# Patient Record
Sex: Male | Born: 1985 | Race: White | Hispanic: No | Marital: Single | Smoking: Former smoker
Health system: Southern US, Community
[De-identification: ages and names within clinical notes are randomized; demographics above are authoritative.]

## PROBLEM LIST (undated history)

## (undated) DIAGNOSIS — Z8679 Personal history of other diseases of the circulatory system: Secondary | ICD-10-CM

## (undated) DIAGNOSIS — I10 Essential (primary) hypertension: Secondary | ICD-10-CM

---

## 2004-06-18 ENCOUNTER — Emergency Department: Payer: Self-pay | Admitting: Emergency Medicine

## 2016-06-05 ENCOUNTER — Emergency Department
Admission: EM | Admit: 2016-06-05 | Discharge: 2016-06-05 | Disposition: A | Discharge status: Home / Self Care | Payer: Self-pay | Attending: Emergency Medicine | Admitting: Emergency Medicine

## 2016-06-05 ENCOUNTER — Encounter: Payer: Self-pay | Admitting: Emergency Medicine

## 2016-06-05 DIAGNOSIS — L0591 Pilonidal cyst without abscess: Secondary | ICD-10-CM | POA: Insufficient documentation

## 2016-06-05 MED ORDER — CEPHALEXIN 500 MG PO CAPS: 500.0000 mg | ORAL_CAPSULE | Freq: Four times a day (QID) | ORAL | 0 refills | Status: DC

## 2016-06-05 MED ORDER — CLINDAMYCIN HCL 300 MG PO CAPS: 300.0000 mg | ORAL_CAPSULE | Freq: Three times a day (TID) | ORAL | 0 refills | Status: DC

## 2016-06-05 MED ORDER — HYDROCODONE-ACETAMINOPHEN 5-325 MG PO TABS: 1.0000 | ORAL_TABLET | Freq: Four times a day (QID) | ORAL | 0 refills | Status: DC | PRN

## 2016-06-05 NOTE — ED Notes (Signed)
Patient here for sciatica pain.

## 2016-06-05 NOTE — ED Triage Notes (Signed)
Patient ambulatory to triage with steady gait, without difficulty or distress noted; pt reports pain to backside of left buttock/thigh upon awakening this am with no known cause

## 2016-06-05 NOTE — Discharge Instructions (Signed)
You have been seen today in the Emergency Department (ED) for pain along the edge of your buttock which I suspect is from a superficial skin infection. Please take your antibiotics as prescribed for their ENTIRE prescribed duration.  Please follow up with your doctor or in the ED in 24-48 hours for recheck of your infection if you are not improving.  Call your doctor sooner or return to the ED if you develop worsening signs of infection such as: increased redness, increased pain, pus, fever, or other symptoms that concern you.

## 2016-06-05 NOTE — ED Notes (Signed)
Pt. Denies injury. Shooting pain through the Left buttock/side/thigh for several days, worse with activity. Pt. States as long as still or moderate counter-pressure, pain eases slightly.

## 2016-06-05 NOTE — ED Provider Notes (Signed)
[facility] Emergency Department Provider Note   ____________________________________________   First MD Initiated Contact with Patient 06/05/16 [phone]     (approximate)  I have reviewed the triage vital signs and the nursing notes.   HISTORY  Chief Complaint Leg Pain    HPI Karl Nicholson is a 30 y.o. male having pain along the edge of his left buttock, along the cleft for about 2-3 days. No redness fever or chills. No pain in or around his rectum.  Patient reports he is healthy. This has never happened to him before. No numbness tingling or weakness. No poor circulation painful leg or foot. Reports the pain is clear on his left buttock, right in the crease between the cheeks.   History reviewed. No pertinent past medical history. Denies previous history There are no active problems to display for this patient.   History reviewed. No pertinent surgical history.  Prior to Admission medications   Medication Sig Start Date End Date Taking? Authorizing Provider  cephALEXin (KEFLEX) 500 MG capsule Take 1 capsule (500 mg total) by mouth 4 (four) times daily. 06/05/16   Sharyn CreamerMark Quale, MD  HYDROcodone-acetaminophen (NORCO/VICODIN) 5-325 MG tablet Take 1 tablet by mouth every 6 (six) hours as needed for moderate pain. 06/05/16   Sharyn CreamerMark Quale, MD    Allergies Penicillins  History reviewed. No pertinent family history.  Social History Social History  Substance Use Topics  . Smoking status: Never Smoker  . Smokeless tobacco: Never Used  . Alcohol use No    Review of Systems Constitutional: No fever/chills Eyes: No visual changes. ENT: No sore throat. Cardiovascular: Denies chest pain. Respiratory: Denies shortness of breath. Gastrointestinal: No abdominal pain.  No nausea, no vomiting.  No diarrhea.  No constipation. Genitourinary: Negative for dysuria. Musculoskeletal: Negative for back pain. No pain in the legs. No pain in his testicles or  groin. Skin: Negative for rash. Neurological: Negative for headaches, focal weakness or numbness.  10-point ROS otherwise negative.  ____________________________________________   PHYSICAL EXAM:  VITAL SIGNS: ED Triage Vitals  Enc Vitals Group     BP --      Pulse Rate 06/05/16 0640 86     Resp 06/05/16 0640 18     Temp 06/05/16 0640 97.4 F (36.3 C)     Temp Source 06/05/16 0640 Oral     SpO2 06/05/16 0640 96 %     Weight 06/05/16 0640 (!) 340 lb (154.2 kg)     Height 06/05/16 0640 6' (1.829 m)     Head Circumference --      Peak Flow --      Pain Score 06/05/16 0641 8     Pain Loc --      Pain Edu? --      Excl. in GC? --     Constitutional: Alert and oriented. Well appearing and in no acute distress. Eyes: Conjunctivae are normal. PERRL. EOMI. Head: Atraumatic. Nose: No congestion/rhinnorhea. Mouth/Throat: Mucous membranes are moist.  Oropharynx non-erythematous. Neck: No stridor.   Cardiovascular: Normal rate, regular rhythm. Grossly normal heart sounds.  Good peripheral circulation. Respiratory: Normal respiratory effort.  No retractions. Lungs CTAB. Gastrointestinal: Soft and nontender. No distention. No abdominal bruits. No CVA tenderness. Musculoskeletal: Lower Extremities  Normal scrotum and testicles bilateral. No groin pain or mass.  The patient's gluteal cleft was examined closely, he has focal tenderness along the middle portion of the left gluteal cleft, but no tenderness perirectally or within the rectum on digital  examination. No herniations or evidence of erythema or infection along the perineum.  The patient has point tenderness at the base of a small group of hair follicles on the left gluteal cleft, with without evidence of erythema or large fluid collection.  No edema. Normal DP/PT pulses bilateral with good cap refill.  Normal neuro-motor function lower extremities bilateral.  RIGHT Right lower extremity demonstrates normal strength, good use  of all muscles. No edema bruising or contusions of the right hip, right knee, right ankle. Full range of motion of the right lower extremity without pain. No pain on axial loading. No evidence of trauma.  LEFT Left lower extremity demonstrates normal strength, good use of all muscles. No edema bruising or contusions of the hip,  knee, ankle. Full range of motion of the left lower extremity without pain. No pain on axial loading. No evidence of trauma.  Neurologic:  Normal speech and language. No gross focal neurologic deficits are appreciated. No gait instability. Skin:  Skin is warm, dry and intact. No rash noted. Psychiatric: Mood and affect are normal. Speech and behavior are normal.  ____________________________________________   LABS (all labs ordered are listed, but only abnormal results are displayed)  Labs Reviewed - No data to display ____________________________________________  EKG   ____________________________________________  RADIOLOGY   ____________________________________________   PROCEDURES  Procedure(s) performed: None  Procedures  Critical Care performed: No  ____________________________________________   INITIAL IMPRESSION / ASSESSMENT AND PLAN / ED COURSE  Pertinent labs & imaging results that were available during my care of the patient were reviewed by me and considered in my medical decision making (see chart for details).  Patient presentation of left medial buttock pain, very focal area of tenderness along the base of a few hair follicles. No evidence of perirectal abscess. No evidence of obvious superinfection or abscess noted. I suspect the patient may have a small left gluteal cleft cyst developing, though no evidence of complication. He'll start him on antibiotic, we did discuss options but based on his financial constraints we will select from the $4 list at [facility]. He has a history of penicillin, but he reports that he can tolerate  amoxicillin and has no other allergies. He was told he may have an allergy to it when he was a child.  I will prescribe the patient a narcotic pain medicine due to their condition which I anticipate will cause at least moderate pain short term. I discussed with the patient safe use of narcotic pain medicines, and that they are not to drive, work in dangerous areas, or ever take more than prescribed (no more than 1 pill every 6 hours). We discussed that this is the type of medication that can be  overdosed on and the risks of this type of medicine. Patient is very agreeable to only use as prescribed and to never use more than prescribed.  Patient given follow-up instructions for general surgery for reevaluation. Return precautions and treatment recommendations and follow-up discussed with the patient who is agreeable with the plan.   Clinical Course     ____________________________________________   FINAL CLINICAL IMPRESSION(S) / ED DIAGNOSES  Final diagnoses:  Infected pilonidal cyst  Pilonidal cyst without abscess      NEW MEDICATIONS STARTED DURING THIS VISIT:  New Prescriptions   CEPHALEXIN (KEFLEX) 500 MG CAPSULE    Take 1 capsule (500 mg total) by mouth 4 (four) times daily.   HYDROCODONE-ACETAMINOPHEN (NORCO/VICODIN) 5-325 MG TABLET    Take 1 tablet by mouth  every 6 (six) hours as needed for moderate pain.     Note:  This document was prepared using Dragon voice recognition software and may include unintentional dictation errors.     Sharyn Creamer, MD 06/05/16 0730

## 2016-06-05 NOTE — ED Notes (Signed)
ED MD at bedside at this time.

## 2016-07-03 ENCOUNTER — Telehealth: Payer: Self-pay

## 2016-07-03 NOTE — Telephone Encounter (Signed)
Called and left a message on patient's voicemail.   Dr. Everlene FarrierPabon wants to follow up with patient in reference to his pilonidal cyst. Patient was seen at the emergency room on 06/05/2016. If patient calls, please schedule him an appointment to see Dr. Everlene FarrierPabon this week.

## 2016-09-17 ENCOUNTER — Emergency Department
Admission: EM | Admit: 2016-09-17 | Discharge: 2016-09-17 | Disposition: A | Discharge status: Home / Self Care | Payer: Self-pay | Attending: Emergency Medicine | Admitting: Emergency Medicine

## 2016-09-17 ENCOUNTER — Emergency Department: Payer: Self-pay

## 2016-09-17 ENCOUNTER — Encounter: Payer: Self-pay | Admitting: Emergency Medicine

## 2016-09-17 DIAGNOSIS — R0789 Other chest pain: Secondary | ICD-10-CM | POA: Insufficient documentation

## 2016-09-17 DIAGNOSIS — R079 Chest pain, unspecified: Secondary | ICD-10-CM

## 2016-09-17 LAB — TROPONIN I

## 2016-09-17 LAB — COMPREHENSIVE METABOLIC PANEL
ALT: 45 U/L (ref 17–63)
ANION GAP: 11 (ref 5–15)
AST: 35 U/L (ref 15–41)
Albumin: 3.9 g/dL (ref 3.5–5.0)
Alkaline Phosphatase: 67 U/L (ref 38–126)
BILIRUBIN TOTAL: 1.3 mg/dL — AB (ref 0.3–1.2)
BUN: 12 mg/dL (ref 6–20)
CHLORIDE: 103 mmol/L (ref 101–111)
CO2: 23 mmol/L (ref 22–32)
Calcium: 8.6 mg/dL — ABNORMAL LOW (ref 8.9–10.3)
Creatinine, Ser: 0.98 mg/dL (ref 0.61–1.24)
Glucose, Bld: 96 mg/dL (ref 65–99)
POTASSIUM: 3.7 mmol/L (ref 3.5–5.1)
Sodium: 137 mmol/L (ref 135–145)
TOTAL PROTEIN: 7.3 g/dL (ref 6.5–8.1)

## 2016-09-17 LAB — CBC WITH DIFFERENTIAL/PLATELET
Basophils Absolute: 0.1 10*3/uL (ref 0–0.1)
Basophils Relative: 1 %
EOS PCT: 2 %
Eosinophils Absolute: 0.2 10*3/uL (ref 0–0.7)
HCT: 43.5 % (ref 40.0–52.0)
Hemoglobin: 15.1 g/dL (ref 13.0–18.0)
LYMPHS PCT: 22 %
Lymphs Abs: 2.2 10*3/uL (ref 1.0–3.6)
MCH: 28.2 pg (ref 26.0–34.0)
MCHC: 34.7 g/dL (ref 32.0–36.0)
MCV: 81.1 fL (ref 80.0–100.0)
MONO ABS: 1.1 10*3/uL — AB (ref 0.2–1.0)
MONOS PCT: 11 %
Neutro Abs: 6.3 10*3/uL (ref 1.4–6.5)
Neutrophils Relative %: 64 %
PLATELETS: 230 10*3/uL (ref 150–440)
RBC: 5.36 MIL/uL (ref 4.40–5.90)
RDW: 13.5 % (ref 11.5–14.5)
WBC: 9.9 10*3/uL (ref 3.8–10.6)

## 2016-09-17 MED ORDER — FAMOTIDINE 20 MG PO TABS: 20.0000 mg | ORAL_TABLET | Freq: Two times a day (BID) | ORAL | 0 refills | Status: DC

## 2016-09-17 MED ORDER — ALUMINUM-MAGNESIUM-SIMETHICONE 200-200-20 MG/5ML PO SUSP: 30.0000 mL | Freq: Three times a day (TID) | ORAL | 0 refills | Status: DC

## 2016-09-17 MED ORDER — FAMOTIDINE 20 MG PO TABS
40.0000 mg | ORAL_TABLET | Freq: Once | ORAL | Status: AC
  Administered 2016-09-17: 40 mg via ORAL
  Filled 2016-09-17: qty 2

## 2016-09-17 MED ORDER — GI COCKTAIL ~~LOC~~
30.0000 mL | ORAL | Status: AC
  Administered 2016-09-17: 30 mL via ORAL
  Filled 2016-09-17: qty 30

## 2016-09-17 NOTE — ED Triage Notes (Signed)
Chest pain x 3 days. Worse after eating and taking a deep breath.  Pt states he has taken meds for reflux but not helping.  Skin w/d with good color.

## 2016-09-17 NOTE — ED Provider Notes (Signed)
[facility] Emergency Department Provider Note  ____________________________________________  Time seen: Approximately 1:55 PM  I have reviewed the triage vital signs and the nursing notes.   HISTORY  Chief Complaint Chest Pain    HPI Karl Nicholson is a 31 y.o. male who complains of 3 days of intermittent central chest tightness and sharp pain. Moderate intensity, lasting a few seconds at a time, worse with breathing. Not exertional. No shortness of breath. No cough fever chills diaphoresis vomiting or radiation. Also worse with eating. Tried various antacids without relief. Never had anything like this before. Denies medical history or surgical history.     History reviewed. No pertinent past medical history.   There are no active problems to display for this patient.    History reviewed. No pertinent surgical history.   Prior to Admission medications   Medication Sig Start Date End Date Taking? Authorizing Provider  acetaminophen (TYLENOL) 500 MG tablet Take 500 mg by mouth every 6 (six) hours as needed.   Yes Historical Provider, MD  bismuth subsalicylate (PEPTO BISMOL) 262 MG/15ML suspension Take 30 mLs by mouth every 6 (six) hours as needed.   Yes Historical Provider, MD  aluminum-magnesium hydroxide-simethicone (MAALOX) 200-200-20 MG/5ML SUSP Take 30 mLs by mouth 4 (four) times daily -  before meals and at bedtime. 09/17/16   Sharman Cheek, MD  famotidine (PEPCID) 20 MG tablet Take 1 tablet (20 mg total) by mouth 2 (two) times daily. 09/17/16   Sharman Cheek, MD     Allergies Penicillins   No family history on file.  Social History Social History  Substance Use Topics  . Smoking status: Never Smoker  . Smokeless tobacco: Never Used  . Alcohol use No    Review of Systems  Constitutional:   No fever or chills.  ENT:   No sore throat. No rhinorrhea. Cardiovascular:   Positive as above chest pain. Respiratory:   No dyspnea or  cough. Gastrointestinal:   Negative for abdominal pain, vomiting and diarrhea.  Genitourinary:   Negative for dysuria or difficulty urinating. Musculoskeletal:   Negative for focal pain or swelling Neurological:   Negative for headaches 10-point ROS otherwise negative.  ____________________________________________   PHYSICAL EXAM:  VITAL SIGNS: ED Triage Vitals  Enc Vitals Group     BP 09/17/16 1152 130/83     Pulse Rate 09/17/16 1152 88     Resp 09/17/16 1152 20     Temp 09/17/16 1152 98.3 F (36.8 C)     Temp Source 09/17/16 1152 Oral     SpO2 09/17/16 1152 94 %     Weight 09/17/16 1147 (!) 350 lb (158.8 kg)     Height 09/17/16 1147 6' (1.829 m)     Head Circumference --      Peak Flow --      Pain Score 09/17/16 1148 4     Pain Loc --      Pain Edu? --      Excl. in GC? --     Vital signs reviewed, nursing assessments reviewed.   Constitutional:   Alert and oriented. Well appearing and in no distress. Eyes:   No scleral icterus. No conjunctival pallor. PERRL. EOMI.  No nystagmus. ENT   Head:   Normocephalic and atraumatic.   Nose:   No congestion/rhinnorhea. No septal hematoma   Mouth/Throat:   MMM, no pharyngeal erythema. No peritonsillar mass.    Neck:   No stridor. No SubQ emphysema. No meningismus. Hematological/Lymphatic/Immunilogical:  No cervical lymphadenopathy. Cardiovascular:   RRR. Symmetric bilateral radial and DP pulses.  No murmurs.  Respiratory:   Normal respiratory effort without tachypnea nor retractions. Breath sounds are clear and equal bilaterally. No wheezes/rales/rhonchi.Chest wall nontender Gastrointestinal:   Soft and nontender. Non distended. There is no CVA tenderness.  No rebound, rigidity, or guarding. Genitourinary:   deferred Musculoskeletal:   Nontender with normal range of motion in all extremities. No joint effusions.  No lower extremity tenderness.  No edema. Neurologic:   Normal speech and language.  CN 2-10  normal. Motor grossly intact. No gross focal neurologic deficits are appreciated.  Skin:    Skin is warm, dry and intact. No rash noted.  No petechiae, purpura, or bullae.  ____________________________________________    LABS (pertinent positives/negatives) (all labs ordered are listed, but only abnormal results are displayed) Labs Reviewed  CBC WITH DIFFERENTIAL/PLATELET - Abnormal; Notable for the following:       Result Value   Monocytes Absolute 1.1 (*)    All other components within normal limits  COMPREHENSIVE METABOLIC PANEL - Abnormal; Notable for the following:    Calcium 8.6 (*)    Total Bilirubin 1.3 (*)    All other components within normal limits  TROPONIN I   ____________________________________________   EKG  Interpreted by me  Date: 09/17/2016  Rate: 98  Rhythm: normal sinus rhythm  QRS Axis: normal  Intervals: normal  ST/T Wave abnormalities: normal  Conduction Disutrbances: none  Narrative Interpretation: unremarkable      ____________________________________________    RADIOLOGY  Chest x-ray unremarkable  ____________________________________________   PROCEDURES Procedures  ____________________________________________   INITIAL IMPRESSION / ASSESSMENT AND PLAN / ED COURSE  Pertinent labs & imaging results that were available during my care of the patient were reviewed by me and considered in my medical decision making (see chart for details).  Patient well appearing no acute distress, vital signs EKG unremarkable, no comorbidities or other risk factors. Considering the patient's symptoms, medical history, and physical examination today, I have low suspicion for ACS, PE, TAD, pneumothorax, carditis, mediastinitis, pneumonia, CHF, or sepsis.  Despite lack of response to antacids prior to arrival in the emergency department, I think high suspicion is for GERD. We'll treat with GI cocktail and Pepcid. Labs chest x-ray EKG all unremarkable.  Symptomatology atypical with fleeting pain, very likely to be GI related. We will discharge home, follow up with primary care.     Clinical Course    ____________________________________________   FINAL CLINICAL IMPRESSION(S) / ED DIAGNOSES  Final diagnoses:  Nonspecific chest pain      New Prescriptions   ALUMINUM-MAGNESIUM HYDROXIDE-SIMETHICONE (MAALOX) 200-200-20 MG/5ML SUSP    Take 30 mLs by mouth 4 (four) times daily -  before meals and at bedtime.   FAMOTIDINE (PEPCID) 20 MG TABLET    Take 1 tablet (20 mg total) by mouth 2 (two) times daily.     Portions of this note were generated with dragon dictation software. Dictation errors may occur despite best attempts at proofreading.    Sharman CheekPhillip Sukhmani Fetherolf, MD 09/17/16 1357

## 2017-09-17 ENCOUNTER — Emergency Department: Payer: Self-pay

## 2017-09-17 ENCOUNTER — Other Ambulatory Visit: Payer: Self-pay

## 2017-09-17 ENCOUNTER — Emergency Department
Admission: EM | Admit: 2017-09-17 | Discharge: 2017-09-17 | Disposition: A | Discharge status: Home / Self Care | Payer: Self-pay | Attending: Emergency Medicine | Admitting: Emergency Medicine

## 2017-09-17 ENCOUNTER — Encounter: Payer: Self-pay | Admitting: Emergency Medicine

## 2017-09-17 DIAGNOSIS — Z87891 Personal history of nicotine dependence: Secondary | ICD-10-CM | POA: Insufficient documentation

## 2017-09-17 DIAGNOSIS — K6389 Other specified diseases of intestine: Secondary | ICD-10-CM

## 2017-09-17 DIAGNOSIS — Z79899 Other long term (current) drug therapy: Secondary | ICD-10-CM | POA: Insufficient documentation

## 2017-09-17 DIAGNOSIS — K529 Noninfective gastroenteritis and colitis, unspecified: Secondary | ICD-10-CM | POA: Insufficient documentation

## 2017-09-17 LAB — URINALYSIS, COMPLETE (UACMP) WITH MICROSCOPIC
Bacteria, UA: NONE SEEN
Bilirubin Urine: NEGATIVE
GLUCOSE, UA: NEGATIVE mg/dL
Hgb urine dipstick: NEGATIVE
Ketones, ur: NEGATIVE mg/dL
Leukocytes, UA: NEGATIVE
Nitrite: NEGATIVE
PH: 7 (ref 5.0–8.0)
Protein, ur: NEGATIVE mg/dL
Specific Gravity, Urine: 1.02 (ref 1.005–1.030)
Squamous Epithelial / LPF: NONE SEEN

## 2017-09-17 LAB — LIPASE, BLOOD: Lipase: 29 U/L (ref 11–51)

## 2017-09-17 LAB — CBC
HCT: 46.2 % (ref 40.0–52.0)
Hemoglobin: 15.5 g/dL (ref 13.0–18.0)
MCH: 28 pg (ref 26.0–34.0)
MCHC: 33.7 g/dL (ref 32.0–36.0)
MCV: 83.2 fL (ref 80.0–100.0)
PLATELETS: 245 10*3/uL (ref 150–440)
RBC: 5.55 MIL/uL (ref 4.40–5.90)
RDW: 13.3 % (ref 11.5–14.5)
WBC: 11 10*3/uL — ABNORMAL HIGH (ref 3.8–10.6)

## 2017-09-17 LAB — COMPREHENSIVE METABOLIC PANEL
ALK PHOS: 90 U/L (ref 38–126)
ALT: 64 U/L — AB (ref 17–63)
AST: 56 U/L — ABNORMAL HIGH (ref 15–41)
Albumin: 4.7 g/dL (ref 3.5–5.0)
Anion gap: 10 (ref 5–15)
BUN: 11 mg/dL (ref 6–20)
CALCIUM: 9.2 mg/dL (ref 8.9–10.3)
CO2: 25 mmol/L (ref 22–32)
CREATININE: 1.13 mg/dL (ref 0.61–1.24)
Chloride: 102 mmol/L (ref 101–111)
GFR calc non Af Amer: >60 mL/min (ref >60)
GLUCOSE: 100 mg/dL — AB (ref 65–99)
Potassium: 3.9 mmol/L (ref 3.5–5.1)
SODIUM: 137 mmol/L (ref 135–145)
Total Bilirubin: 1 mg/dL (ref 0.3–1.2)
Total Protein: 8.1 g/dL (ref 6.5–8.1)

## 2017-09-17 MED ORDER — ONDANSETRON HCL 4 MG PO TABS: 4.0000 mg | ORAL_TABLET | Freq: Every day | ORAL | 0 refills | Status: AC | PRN

## 2017-09-17 MED ORDER — GI COCKTAIL ~~LOC~~
30.0000 mL | Freq: Once | ORAL | Status: AC
  Administered 2017-09-17: 30 mL via ORAL
  Filled 2017-09-17: qty 30

## 2017-09-17 MED ORDER — HYDROCODONE-ACETAMINOPHEN 5-325 MG PO TABS: 1.0000 | ORAL_TABLET | Freq: Four times a day (QID) | ORAL | 0 refills | Status: DC | PRN

## 2017-09-17 MED ORDER — FAMOTIDINE 20 MG PO TABS
40.0000 mg | ORAL_TABLET | Freq: Once | ORAL | Status: AC
  Administered 2017-09-17: 40 mg via ORAL
  Filled 2017-09-17: qty 2

## 2017-09-17 NOTE — ED Notes (Addendum)
Pt to the er for belly pain to the lower left side belly button to flank. Pt states pain is increased with movement , bending over or laying a certain way. Sitting still pain is tolerable. No changes in bowel habits. No changes in eating. Pt reports nausea after breakfast and vomited. Pt ate chicken this afternoon with no vomiting. Pt states if he eats a big meal it hurts. Last bowel movement was today and normal. No difficulty urinating.

## 2017-09-17 NOTE — Discharge Instructions (Addendum)
Fortunately today your blood work, your urinalysis, and your CT scan were reassuring.  It is normal for your pain to last up to a full 7 days with this disorder.  You should make a full recovery with no complications.  Please return to the emergency department for any new or worsening symptoms such as worsening pain, fevers, chills, if you cannot eat or drink, or for any other issues whatsoever.  It was a pleasure to take care of you today, and thank you for coming to our emergency department.  If you have any questions or concerns before leaving please ask the nurse to grab me and I'm more than happy to go through your aftercare instructions again.  If you were prescribed any opioid pain medication today such as Norco, Vicodin, Percocet, morphine, hydrocodone, or oxycodone please make sure you do not drive when you are taking this medication as it can alter your ability to drive safely.  If you have any concerns once you are home that you are not improving or are in fact getting worse before you can make it to your follow-up appointment, please do not hesitate to call 911 and come back for further evaluation.  Merrily Brittle, MD  Results for orders placed or performed during the hospital encounter of 09/17/17  Lipase, blood  Result Value Ref Range   Lipase 29 11 - 51 U/L  Comprehensive metabolic panel  Result Value Ref Range   Sodium 137 135 - 145 mmol/L   Potassium 3.9 3.5 - 5.1 mmol/L   Chloride 102 101 - 111 mmol/L   CO2 25 22 - 32 mmol/L   Glucose, Bld 100 (H) 65 - 99 mg/dL   BUN 11 6 - 20 mg/dL   Creatinine, Ser 1.61 0.61 - 1.24 mg/dL   Calcium 9.2 8.9 - 09.6 mg/dL   Total Protein 8.1 6.5 - 8.1 g/dL   Albumin 4.7 3.5 - 5.0 g/dL   AST 56 (H) 15 - 41 U/L   ALT 64 (H) 17 - 63 U/L   Alkaline Phosphatase 90 38 - 126 U/L   Total Bilirubin 1.0 0.3 - 1.2 mg/dL   GFR calc non Af Amer >60 >60 mL/min   GFR calc Af Amer >60 >60 mL/min   Anion gap 10 5 - 15  CBC  Result Value Ref Range   WBC 11.0 (H) 3.8 - 10.6 K/uL   RBC 5.55 4.40 - 5.90 MIL/uL   Hemoglobin 15.5 13.0 - 18.0 g/dL   HCT 04.5 40.9 - 81.1 %   MCV 83.2 80.0 - 100.0 fL   MCH 28.0 26.0 - 34.0 pg   MCHC 33.7 32.0 - 36.0 g/dL   RDW 91.4 78.2 - 95.6 %   Platelets 245 150 - 440 K/uL  Urinalysis, Complete w Microscopic  Result Value Ref Range   Color, Urine YELLOW (A) YELLOW   APPearance CLEAR (A) CLEAR   Specific Gravity, Urine 1.020 1.005 - 1.030   pH 7.0 5.0 - 8.0   Glucose, UA NEGATIVE NEGATIVE mg/dL   Hgb urine dipstick NEGATIVE NEGATIVE   Bilirubin Urine NEGATIVE NEGATIVE   Ketones, ur NEGATIVE NEGATIVE mg/dL   Protein, ur NEGATIVE NEGATIVE mg/dL   Nitrite NEGATIVE NEGATIVE   Leukocytes, UA NEGATIVE NEGATIVE   RBC / HPF 0-5 0 - 5 RBC/hpf   WBC, UA 0-5 0 - 5 WBC/hpf   Bacteria, UA NONE SEEN NONE SEEN   Squamous Epithelial / LPF NONE SEEN NONE SEEN   Mucus PRESENT  Ct Abdomen Pelvis Wo Contrast  Result Date: 09/17/2017 CLINICAL DATA:  Left lower quadrant pain worsening over 3-4 days. EXAM: CT ABDOMEN AND PELVIS WITHOUT CONTRAST TECHNIQUE: Multidetector CT imaging of the abdomen and pelvis was performed following the standard protocol without IV contrast. COMPARISON:  None. FINDINGS: Lower chest: The lung bases are clear. Hepatobiliary: No focal liver abnormality is seen. No gallstones, gallbladder wall thickening, or biliary dilatation. Pancreas: Unremarkable. No pancreatic ductal dilatation or surrounding inflammatory changes. Spleen: Normal in size without focal abnormality. Adrenals/Urinary Tract: Adrenal glands are unremarkable. Kidneys are normal, without renal calculi, focal lesion, or hydronephrosis. Bladder is unremarkable. Stomach/Bowel: There is infiltration around the fat lobule in the left lower quadrant consistent with epiploic appendagitis. No colonic wall thickening or distention. Scattered stool throughout the colon. Stomach and small bowel are decompressed. Appendix is normal.  Vascular/Lymphatic: No significant vascular findings are present. No enlarged abdominal or pelvic lymph nodes. Reproductive: Prostate is unremarkable. Other: No abdominal wall hernia or abnormality. No abdominopelvic ascites. Musculoskeletal: No acute or significant osseous findings. IMPRESSION: 1. Inflammatory changes in the left lower quadrant consistent with epiploic appendagitis. 2. No evidence of bowel obstruction. Electronically Signed   By: Burman NievesWilliam  Stevens M.D.   On: 09/17/2017 20:50

## 2017-09-17 NOTE — ED Provider Notes (Signed)
[facility] Emergency Department Provider Note  ____________________________________________   First MD Initiated Contact with Patient 09/17/17 2016     (approximate)  I have reviewed the triage vital signs and the nursing notes.   HISTORY  Chief Complaint Abdominal Pain   HPI Karl Nicholson is a 32 y.o. male who comes to the emergency department with 3-4 days of moderate severity cramping left lower quadrant abdominal pain.  It is associate with nausea but no vomiting.  No diarrhea.  He has no history of abdominal surgeries.  Normal bowel movements.  No diarrhea.  No fevers or chills.  His pain is constant throbbing aching.  Moderate severity nonradiating.  Nothing seems to make it better or worse.  It is been constant.  History reviewed. No pertinent past medical history.  There are no active problems to display for this patient.   History reviewed. No pertinent surgical history.  Prior to Admission medications   Medication Sig Start Date End Date Taking? Authorizing Provider  acetaminophen (TYLENOL) 500 MG tablet Take 500 mg by mouth every 6 (six) hours as needed.    [provider]  aluminum-magnesium hydroxide-simethicone (MAALOX) 200-200-20 MG/5ML SUSP Take 30 mLs by mouth 4 (four) times daily -  before meals and at bedtime. 09/17/16   Sharman CheekStafford, Phillip, MD  bismuth subsalicylate (PEPTO BISMOL) 262 MG/15ML suspension Take 30 mLs by mouth every 6 (six) hours as needed.    [provider]  famotidine (PEPCID) 20 MG tablet Take 1 tablet (20 mg total) by mouth 2 (two) times daily. 09/17/16   Sharman CheekStafford, Phillip, MD  HYDROcodone-acetaminophen (NORCO) 5-325 MG tablet Take 1 tablet by mouth every 6 (six) hours as needed for up to 7 doses for severe pain. 09/17/17   Merrily Brittleifenbark, Ivelise Castillo, MD  ondansetron (ZOFRAN) 4 MG tablet Take 1 tablet (4 mg total) by mouth daily as needed. 09/17/17 09/17/18  Merrily Brittleifenbark, Angalina Ante, MD    Allergies Penicillins  No family  history on file.  Social History Social History   Tobacco Use  . Smoking status: Former Games developermoker  . Smokeless tobacco: Never Used  Substance Use Topics  . Alcohol use: No  . Drug use: Not on file    Review of Systems Constitutional: No fever/chills Eyes: No visual changes. ENT: No sore throat. Cardiovascular: Denies chest pain. Respiratory: Denies shortness of breath. Gastrointestinal: Positive for abdominal pain.  Positive for nausea, no vomiting.  No diarrhea.  No constipation. Genitourinary: Negative for dysuria. Musculoskeletal: Negative for back pain. Skin: Negative for rash. Neurological: Negative for headaches, focal weakness or numbness.   ____________________________________________   PHYSICAL EXAM:  VITAL SIGNS: ED Triage Vitals  Enc Vitals Group     BP 09/17/17 1812 (!) 141/72     Pulse Rate 09/17/17 1812 95     Resp 09/17/17 1812 18     Temp 09/17/17 1812 98.7 F (37.1 C)     Temp Source 09/17/17 1812 Oral     SpO2 09/17/17 1812 94 %     Weight 09/17/17 1812 (!) 350 lb (158.8 kg)     Height 09/17/17 1812 6' (1.829 m)     Head Circumference --      Peak Flow --      Pain Score 09/17/17 1811 6     Pain Loc --      Pain Edu? --      Excl. in GC? --     Constitutional: Alert and oriented x4 well-appearing nontoxic no diaphoresis speaks  full clear sentences Eyes: PERRL EOMI. Head: Atraumatic. Nose: No congestion/rhinnorhea. Mouth/Throat: No trismus Neck: No stridor.   Cardiovascular: Normal rate, regular rhythm. Grossly normal heart sounds.  Good peripheral circulation. Respiratory: Normal respiratory effort.  No retractions. Lungs CTAB and moving good air Gastrointestinal: Morbidly obese abdomen no frank peritonitis mild left lower quadrant tenderness no rebound or guarding no peritonitis negative Rovsing's no McBurney's tenderness Musculoskeletal: No lower extremity edema   Neurologic:  Normal speech and language. No gross focal neurologic deficits  are appreciated. Skin:  Skin is warm, dry and intact. No rash noted. Psychiatric: Mood and affect are normal. Speech and behavior are normal.    ____________________________________________   DIFFERENTIAL includes but not limited to  Appendicitis, diverticulitis, renal colic, urinary tract infection, pyelonephritis, small bowel obstruction ____________________________________________   LABS (all labs ordered are listed, but only abnormal results are displayed)  Labs Reviewed  COMPREHENSIVE METABOLIC PANEL - Abnormal; Notable for the following components:      Result Value   Glucose, Bld 100 (*)    AST 56 (*)    ALT 64 (*)    All other components within normal limits  CBC - Abnormal; Notable for the following components:   WBC 11.0 (*)    All other components within normal limits  URINALYSIS, COMPLETE (UACMP) WITH MICROSCOPIC - Abnormal; Notable for the following components:   Color, Urine YELLOW (*)    APPearance CLEAR (*)    All other components within normal limits  LIPASE, BLOOD    Lab work reviewed by me with slightly elevated white count which is nonspecific and likely secondary to pain __________________________________________  EKG   ____________________________________________  RADIOLOGY  CT abdomen pelvis noncontrast concerning for epiploic appendicitis ____________________________________________   PROCEDURES  Procedure(s) performed: no  Procedures  Critical Care performed: no  Observation: no ____________________________________________   INITIAL IMPRESSION / ASSESSMENT AND PLAN / ED COURSE  Pertinent labs & imaging results that were available during my care of the patient were reviewed by me and considered in my medical decision making (see chart for details).  Differential on a morbidly obese man with constant abdominal pain is broad and at this point I do believe he requires a CT scan.  This can be without contrast.      ----------------------------------------- 9:04 PM on 09/17/2017 -----------------------------------------  The patient CT scan is consistent with epiploic appendage otitis.  He is able to eat and drink and his abdominal pain is well controlled.  I do lengthy discussion with the patient regarding the predicted clinical course and that his pain should persist for several more days and then resolved.  Strict return precautions were given and the patient verbalized understanding and agreement the plan.  ____________________________________________   FINAL CLINICAL IMPRESSION(S) / ED DIAGNOSES  Final diagnoses:  Epiploic appendagitis      NEW MEDICATIONS STARTED DURING THIS VISIT:  This SmartLink is deprecated. Use AVSMEDLIST instead to display the medication list for a patient.   Note:  This document was prepared using Dragon voice recognition software and may include unintentional dictation errors.     Merrily Brittle, MD 09/18/17 2222

## 2017-09-17 NOTE — ED Triage Notes (Signed)
Lower L abd pain x 4 days.

## 2018-02-17 ENCOUNTER — Other Ambulatory Visit: Payer: Self-pay

## 2018-02-17 ENCOUNTER — Encounter (HOSPITAL_COMMUNITY): Payer: Self-pay | Admitting: Emergency Medicine

## 2018-02-17 ENCOUNTER — Emergency Department (HOSPITAL_COMMUNITY)
Admission: EM | Admit: 2018-02-17 | Discharge: 2018-02-18 | Disposition: A | Discharge status: Home / Self Care | Payer: Self-pay | Attending: Emergency Medicine | Admitting: Emergency Medicine

## 2018-02-17 DIAGNOSIS — Z87891 Personal history of nicotine dependence: Secondary | ICD-10-CM | POA: Insufficient documentation

## 2018-02-17 DIAGNOSIS — Z79899 Other long term (current) drug therapy: Secondary | ICD-10-CM | POA: Insufficient documentation

## 2018-02-17 DIAGNOSIS — K610 Anal abscess: Secondary | ICD-10-CM | POA: Insufficient documentation

## 2018-02-17 NOTE — ED Triage Notes (Signed)
abscess to inner lt buttocks

## 2018-02-18 ENCOUNTER — Emergency Department (HOSPITAL_COMMUNITY): Payer: Self-pay

## 2018-02-18 ENCOUNTER — Other Ambulatory Visit: Payer: Self-pay

## 2018-02-18 LAB — BASIC METABOLIC PANEL
ANION GAP: 8 (ref 5–15)
BUN: 10 mg/dL (ref 6–20)
CALCIUM: 9.2 mg/dL (ref 8.9–10.3)
CO2: 24 mmol/L (ref 22–32)
Chloride: 103 mmol/L (ref 101–111)
Creatinine, Ser: 1.27 mg/dL — ABNORMAL HIGH (ref 0.61–1.24)
GFR calc Af Amer: >60 mL/min (ref >60)
GLUCOSE: 106 mg/dL — AB (ref 65–99)
Potassium: 4 mmol/L (ref 3.5–5.1)
SODIUM: 135 mmol/L (ref 135–145)

## 2018-02-18 LAB — CBC WITH DIFFERENTIAL/PLATELET
BASOS ABS: 0.1 10*3/uL (ref 0.0–0.1)
BASOS PCT: 1 %
EOS PCT: 2 %
Eosinophils Absolute: 0.2 10*3/uL (ref 0.0–0.7)
HCT: 43.1 % (ref 39.0–52.0)
Hemoglobin: 14.6 g/dL (ref 13.0–17.0)
Lymphocytes Relative: 19 %
Lymphs Abs: 2.4 10*3/uL (ref 0.7–4.0)
MCH: 28.7 pg (ref 26.0–34.0)
MCHC: 33.9 g/dL (ref 30.0–36.0)
MCV: 84.7 fL (ref 78.0–100.0)
Monocytes Absolute: 1.1 10*3/uL — ABNORMAL HIGH (ref 0.1–1.0)
Monocytes Relative: 9 %
Neutro Abs: 8.7 10*3/uL — ABNORMAL HIGH (ref 1.7–7.7)
Neutrophils Relative %: 69 %
PLATELETS: 277 10*3/uL (ref 150–400)
RBC: 5.09 MIL/uL (ref 4.22–5.81)
RDW: 12.9 % (ref 11.5–15.5)
WBC: 12.4 10*3/uL — AB (ref 4.0–10.5)

## 2018-02-18 MED ORDER — FENTANYL CITRATE (PF) 100 MCG/2ML IJ SOLN
100.0000 ug | Freq: Once | INTRAMUSCULAR | Status: AC
  Administered 2018-02-18: 100 ug via INTRAVENOUS
  Filled 2018-02-18: qty 2

## 2018-02-18 MED ORDER — LIDOCAINE-EPINEPHRINE (PF) 1 %-1:200000 IJ SOLN
INTRAMUSCULAR | Status: AC
  Administered 2018-02-18: 30 mL
  Filled 2018-02-18: qty 30

## 2018-02-18 MED ORDER — CIPROFLOXACIN HCL 500 MG PO TABS: 500.0000 mg | ORAL_TABLET | Freq: Two times a day (BID) | ORAL | 0 refills | Status: DC

## 2018-02-18 MED ORDER — METRONIDAZOLE 500 MG PO TABS
500.0000 mg | ORAL_TABLET | Freq: Once | ORAL | Status: AC
  Administered 2018-02-18: 500 mg via ORAL
  Filled 2018-02-18: qty 1

## 2018-02-18 MED ORDER — CIPROFLOXACIN IN D5W 400 MG/200ML IV SOLN
400.0000 mg | Freq: Once | INTRAVENOUS | Status: AC
  Administered 2018-02-18: 400 mg via INTRAVENOUS
  Filled 2018-02-18: qty 200

## 2018-02-18 MED ORDER — METRONIDAZOLE 500 MG PO TABS: 500.0000 mg | ORAL_TABLET | Freq: Two times a day (BID) | ORAL | 0 refills | Status: DC

## 2018-02-18 MED ORDER — HYDROCODONE-ACETAMINOPHEN 5-325 MG PO TABS: 1.0000 | ORAL_TABLET | Freq: Four times a day (QID) | ORAL | 0 refills | Status: DC | PRN

## 2018-02-18 MED ORDER — IOPAMIDOL (ISOVUE-300) INJECTION 61%
100.0000 mL | Freq: Once | INTRAVENOUS | Status: AC | PRN
  Administered 2018-02-18: 100 mL via INTRAVENOUS

## 2018-02-18 NOTE — ED Notes (Signed)
Patient transported to CT 

## 2018-02-18 NOTE — ED Notes (Signed)
ED Provider at bedside. 

## 2018-02-18 NOTE — ED Provider Notes (Signed)
[facility] EMERGENCY DEPARTMENT Provider Note   CSN: [id number] Arrival date & time: 02/17/18  2154     History   Chief Complaint Chief Complaint  Patient presents with  . Abscess    HPI Karl Nicholson is a 32 y.o. male.  The history is provided by the patient.  Abscess  Location:  Pelvis Pelvic abscess location:  L buttock Abscess quality: induration, painful and redness   Duration:  1 week Progression:  Worsening Pain details:    Quality:  Dull and pressure   Severity:  Severe   Timing:  Constant   Progression:  Worsening Chronicity:  New Associated symptoms: fatigue, nausea and vomiting   patient Presents with abscess.  He reports onset of an abscess to his left buttock about a week ago.  He has had this before but usually resolved on its own.  This is worsening.  No drainage.  PMH-none Soc hx - former smoker Home Medications    Prior to Admission medications   Medication Sig Start Date End Date Taking? Authorizing Provider  acetaminophen (TYLENOL) 500 MG tablet Take 500 mg by mouth every 6 (six) hours as needed.    [provider]  aluminum-magnesium hydroxide-simethicone (MAALOX) 200-200-20 MG/5ML SUSP Take 30 mLs by mouth 4 (four) times daily -  before meals and at bedtime. 09/17/16   Sharman CheekStafford, Phillip, MD  bismuth subsalicylate (PEPTO BISMOL) 262 MG/15ML suspension Take 30 mLs by mouth every 6 (six) hours as needed.    [provider]  famotidine (PEPCID) 20 MG tablet Take 1 tablet (20 mg total) by mouth 2 (two) times daily. 09/17/16   Sharman CheekStafford, Phillip, MD  ondansetron (ZOFRAN) 4 MG tablet Take 1 tablet (4 mg total) by mouth daily as needed. 09/17/17 09/17/18  Merrily Brittleifenbark, Neil, MD    Family History No family history on file.  Social History Social History   Tobacco Use  . Smoking status: Former Games developermoker  . Smokeless tobacco: Never Used  Substance Use Topics  . Alcohol use: No  . Drug use: Not on file     Allergies    Penicillins   Review of Systems Review of Systems  Constitutional: Positive for fatigue.  Gastrointestinal: Positive for nausea and vomiting.  Skin: Positive for wound.  All other systems reviewed and are negative.    Physical Exam Updated Vital Signs BP 127/89 (BP Location: Right Arm)   Pulse (!) 108   Temp 98.6 F (37 C) (Oral)   Resp 18   Ht 1.829 m (6')   Wt (!) 158.8 kg (350 lb)   SpO2 98%   BMI 47.47 kg/m   Physical Exam CONSTITUTIONAL: Well developed/well nourished HEAD: Normocephalic/atraumatic EYES: EOMI ENMT: Mucous membranes moist NECK: supple no meningeal signs LUNGS: Lungs are clear to auscultation bilaterally, no apparent distress ABDOMEN: soft, obese GU:no cva tenderness Abscess to left buttock just below rectum diffuse erythema and tenderness noted, no tenderness or erythema in the scrotum. No crepitus.  Nurse chaperone present NEURO: Pt is awake/alert/appropriate, moves all extremitiesx4.   EXTREMITIES:  full ROM SKIN: warm, color normal PSYCH: no abnormalities of mood noted, alert and oriented to situation   ED Treatments / Results  Labs (all labs ordered are listed, but only abnormal results are displayed) Labs Reviewed  BASIC METABOLIC PANEL - Abnormal; Notable for the following components:      Result Value   Glucose, Bld 106 (*)    Creatinine, Ser 1.27 (*)    All other components within normal  limits  CBC WITH DIFFERENTIAL/PLATELET - Abnormal; Notable for the following components:   WBC 12.4 (*)    Neutro Abs 8.7 (*)    Monocytes Absolute 1.1 (*)    All other components within normal limits    EKG None  Radiology Ct Pelvis W Contrast  Result Date: 02/18/2018 CLINICAL DATA:  Worsening left hip buttock abscess for 1 week. EXAM: CT PELVIS WITH CONTRAST TECHNIQUE: Multidetector CT imaging of the pelvis was performed using the standard protocol following the bolus administration of intravenous contrast. CONTRAST:  ISOVUE-300  IOPAMIDOL (ISOVUE-300) INJECTION 61% COMPARISON:  CT abdomen and pelvis September 17, 2017 FINDINGS: Urinary Tract:  Urinary bladder is well distended and unremarkable. Bowel:  Normal course and caliber.  Normal appendix. Vascular/Lymphatic: Normal course and caliber. Resolution (or out of field of view) of prior epiploic appendagitis. Reproductive:  Prostate and seminal vesicles are normal. Other: 1.6 x 3.1 x 5.2 cm (transverse by cc by AP) rim enhancing fluid collection left perianal soft tissues. No subcutaneous gas. No radiopaque foreign bodies. Musculoskeletal: Nonacute. IMPRESSION: 1. 1.6 x 3.1 x 5.2 cm perianal abscess. No intraperitoneal extension or necrotizing fasciitis. Electronically Signed   By: Awilda Metro M.D.   On: 02/18/2018 02:45    Procedures .Marland KitchenIncision and Drainage Date/Time: 02/18/2018 4:47 AM Performed by: Zadie Rhine, MD Authorized by: Zadie Rhine, MD   Consent:    Consent obtained:  Verbal   Consent given by:  Patient Location:    Type:  Abscess   Location:  Anogenital   Anogenital location:  Perianal Pre-procedure details:    Skin preparation:  Betadine Anesthesia (see MAR for exact dosages):    Anesthesia method:  Local infiltration   Local anesthetic:  Lidocaine 2% WITH epi Procedure type:    Complexity:  Complex Procedure details:    Needle aspiration: no     Incision types:  Single straight   Scalpel blade:  11   Wound management:  Probed and deloculated   Drainage:  Purulent   Drainage amount:  Copious   Wound treatment:  Wound left open   Packing materials:  None Post-procedure details:    Patient tolerance of procedure:  Tolerated well, no immediate complications     Medications Ordered in ED Medications  fentaNYL (SUBLIMAZE) injection 100 mcg (100 mcg Intravenous Given 02/18/18 0157)  iopamidol (ISOVUE-300) 61 % injection 100 mL (100 mLs Intravenous Contrast Given 02/18/18 0229)  ciprofloxacin (CIPRO) IVPB 400 mg (0 mg Intravenous  Stopped 02/18/18 0417)  metroNIDAZOLE (FLAGYL) tablet 500 mg (500 mg Oral Given 02/18/18 0312)  fentaNYL (SUBLIMAZE) injection 100 mcg (100 mcg Intravenous Given 02/18/18 0430)  lidocaine-EPINEPHrine (XYLOCAINE-EPINEPHrine) 1 %-1:200000 (PF) injection (30 mLs  Given 02/18/18 0430)     Initial Impression / Assessment and Plan / ED Course  I have reviewed the triage vital signs and the nursing notes.  Pertinent labs & imaging results that were available during my care of the patient were reviewed by me and considered in my medical decision making (see chart for details). Narcotic database reviewed and considered in decision making    2:36 AM CT imaging performed to determine depth of abscess. 4:48 AM I reviewed and discussed CT findings with Dr. Lovell Sheehan with surgery.  Patient is very well-appearing.  Plan is for me to perform incision and drainage in the emergency department, send him home with Cipro and Flagyl with close general surgery follow-up  Patient tolerated I&D very well.  No complications noted.  Will discharge home Final  Clinical Impressions(s) / ED Diagnoses   Final diagnoses:  Perianal abscess    ED Discharge Orders        Ordered    ciprofloxacin (CIPRO) 500 MG tablet  2 times daily     02/18/18 0445    metroNIDAZOLE (FLAGYL) 500 MG tablet  2 times daily     02/18/18 0445    HYDROcodone-acetaminophen (NORCO/VICODIN) 5-325 MG tablet  Every 6 hours PRN     02/18/18 0445       Zadie Rhine, MD 02/18/18 [phone]

## 2018-04-24 ENCOUNTER — Encounter: Payer: Self-pay | Admitting: Emergency Medicine

## 2018-04-24 ENCOUNTER — Emergency Department
Admission: EM | Admit: 2018-04-24 | Discharge: 2018-04-24 | Disposition: A | Discharge status: Home / Self Care | Payer: Self-pay | Attending: Emergency Medicine | Admitting: Emergency Medicine

## 2018-04-24 DIAGNOSIS — S0501XA Injury of conjunctiva and corneal abrasion without foreign body, right eye, initial encounter: Secondary | ICD-10-CM | POA: Insufficient documentation

## 2018-04-24 DIAGNOSIS — Y929 Unspecified place or not applicable: Secondary | ICD-10-CM | POA: Insufficient documentation

## 2018-04-24 DIAGNOSIS — W504XXA Accidental scratch by another person, initial encounter: Secondary | ICD-10-CM | POA: Insufficient documentation

## 2018-04-24 DIAGNOSIS — Y939 Activity, unspecified: Secondary | ICD-10-CM | POA: Insufficient documentation

## 2018-04-24 DIAGNOSIS — Z87891 Personal history of nicotine dependence: Secondary | ICD-10-CM | POA: Insufficient documentation

## 2018-04-24 DIAGNOSIS — Y999 Unspecified external cause status: Secondary | ICD-10-CM | POA: Insufficient documentation

## 2018-04-24 DIAGNOSIS — Z79899 Other long term (current) drug therapy: Secondary | ICD-10-CM | POA: Insufficient documentation

## 2018-04-24 MED ORDER — GENTAMICIN SULFATE 0.3 % OP SOLN: 1.0000 [drp] | OPHTHALMIC | 0 refills | Status: DC

## 2018-04-24 NOTE — Discharge Instructions (Signed)
Use eyedrops as directed and follow discharge care instructions.
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
## 2019-03-21 ENCOUNTER — Other Ambulatory Visit: Payer: Self-pay

## 2019-03-21 ENCOUNTER — Encounter (HOSPITAL_COMMUNITY): Payer: Self-pay | Admitting: Emergency Medicine

## 2019-03-21 ENCOUNTER — Observation Stay (HOSPITAL_COMMUNITY)
Admission: EM | Admit: 2019-03-21 | Discharge: 2019-03-22 | Disposition: A | Discharge status: Home / Self Care | Payer: Self-pay | Attending: Internal Medicine | Admitting: Internal Medicine

## 2019-03-21 ENCOUNTER — Emergency Department (HOSPITAL_COMMUNITY): Payer: Self-pay

## 2019-03-21 DIAGNOSIS — E669 Obesity, unspecified: Secondary | ICD-10-CM | POA: Insufficient documentation

## 2019-03-21 DIAGNOSIS — Z7289 Other problems related to lifestyle: Secondary | ICD-10-CM | POA: Insufficient documentation

## 2019-03-21 DIAGNOSIS — Z7982 Long term (current) use of aspirin: Secondary | ICD-10-CM | POA: Insufficient documentation

## 2019-03-21 DIAGNOSIS — Z1159 Encounter for screening for other viral diseases: Secondary | ICD-10-CM | POA: Insufficient documentation

## 2019-03-21 DIAGNOSIS — Z6841 Body Mass Index (BMI) 40.0 and over, adult: Secondary | ICD-10-CM | POA: Insufficient documentation

## 2019-03-21 DIAGNOSIS — I4891 Unspecified atrial fibrillation: Principal | ICD-10-CM | POA: Insufficient documentation

## 2019-03-21 DIAGNOSIS — Z87891 Personal history of nicotine dependence: Secondary | ICD-10-CM | POA: Insufficient documentation

## 2019-03-21 DIAGNOSIS — Z8679 Personal history of other diseases of the circulatory system: Secondary | ICD-10-CM | POA: Diagnosis present

## 2019-03-21 LAB — MAGNESIUM: Magnesium: 2.2 mg/dL (ref 1.7–2.4)

## 2019-03-21 LAB — CBC WITH DIFFERENTIAL/PLATELET
Abs Immature Granulocytes: 0.03 10*3/uL (ref 0.00–0.07)
Basophils Absolute: 0.1 10*3/uL (ref 0.0–0.1)
Basophils Relative: 1 %
Eosinophils Absolute: 0.4 10*3/uL (ref 0.0–0.5)
Eosinophils Relative: 4 %
HCT: 48.6 % (ref 39.0–52.0)
Hemoglobin: 16.4 g/dL (ref 13.0–17.0)
Immature Granulocytes: 0 %
Lymphocytes Relative: 32 %
Lymphs Abs: 3.2 10*3/uL (ref 0.7–4.0)
MCH: 28.4 pg (ref 26.0–34.0)
MCHC: 33.7 g/dL (ref 30.0–36.0)
MCV: 84.1 fL (ref 80.0–100.0)
Monocytes Absolute: 0.7 10*3/uL (ref 0.1–1.0)
Monocytes Relative: 7 %
Neutro Abs: 5.8 10*3/uL (ref 1.7–7.7)
Neutrophils Relative %: 56 %
Platelets: 267 10*3/uL (ref 150–400)
RBC: 5.78 MIL/uL (ref 4.22–5.81)
RDW: 12.9 % (ref 11.5–15.5)
WBC: 10.2 10*3/uL (ref 4.0–10.5)
nRBC: 0 % (ref 0.0–0.2)

## 2019-03-21 LAB — APTT: aPTT: 31 seconds (ref 24–36)

## 2019-03-21 LAB — COMPREHENSIVE METABOLIC PANEL
ALT: 34 U/L (ref 0–44)
AST: 25 U/L (ref 15–41)
Albumin: 3.9 g/dL (ref 3.5–5.0)
Alkaline Phosphatase: 62 U/L (ref 38–126)
Anion gap: 12 (ref 5–15)
BUN: 10 mg/dL (ref 6–20)
CO2: 23 mmol/L (ref 22–32)
Calcium: 8.6 mg/dL — ABNORMAL LOW (ref 8.9–10.3)
Chloride: 105 mmol/L (ref 98–111)
Creatinine, Ser: 0.95 mg/dL (ref 0.61–1.24)
GFR calc Af Amer: >60 mL/min (ref >60)
GFR calc non Af Amer: >60 mL/min (ref >60)
Glucose, Bld: 110 mg/dL — ABNORMAL HIGH (ref 70–99)
Potassium: 3.7 mmol/L (ref 3.5–5.1)
Sodium: 140 mmol/L (ref 135–145)
Total Bilirubin: 0.3 mg/dL (ref 0.3–1.2)
Total Protein: 6.6 g/dL (ref 6.5–8.1)

## 2019-03-21 LAB — SARS CORONAVIRUS 2 BY RT PCR (HOSPITAL ORDER, PERFORMED IN ~~LOC~~ HOSPITAL LAB): SARS Coronavirus 2: NEGATIVE

## 2019-03-21 LAB — PROTIME-INR
INR: 1 (ref 0.8–1.2)
Prothrombin Time: 12.8 seconds (ref 11.4–15.2)

## 2019-03-21 LAB — TROPONIN I (HIGH SENSITIVITY)
Troponin I (High Sensitivity): 9 ng/L (ref <18)
Troponin I (High Sensitivity): 19 ng/L — ABNORMAL HIGH (ref <18)

## 2019-03-21 LAB — TSH: TSH: 3.317 u[IU]/mL (ref 0.350–4.500)

## 2019-03-21 MED ORDER — ONDANSETRON HCL 4 MG/2ML IJ SOLN: 4.0000 mg | Freq: Four times a day (QID) | INTRAMUSCULAR | Status: DC | PRN

## 2019-03-21 MED ORDER — APIXABAN 5 MG PO TABS
5.0000 mg | ORAL_TABLET | Freq: Two times a day (BID) | ORAL | Status: DC
  Administered 2019-03-21 – 2019-03-22 (×2): 5 mg via ORAL
  Filled 2019-03-21 (×2): qty 1

## 2019-03-21 MED ORDER — DILTIAZEM HCL 100 MG IV SOLR
5.0000 mg/h | INTRAVENOUS | Status: DC
  Administered 2019-03-21: 5 mg/h via INTRAVENOUS
  Administered 2019-03-22: 10 mg/h via INTRAVENOUS
  Filled 2019-03-21 (×2): qty 100

## 2019-03-21 MED ORDER — ASPIRIN EC 325 MG PO TBEC: 650.0000 mg | DELAYED_RELEASE_TABLET | Freq: Once | ORAL | Status: DC

## 2019-03-21 MED ORDER — DILTIAZEM HCL 25 MG/5ML IV SOLN
10.0000 mg | Freq: Once | INTRAVENOUS | Status: AC
  Administered 2019-03-21: 10 mg via INTRAVENOUS
  Filled 2019-03-21: qty 5

## 2019-03-21 MED ORDER — ACETAMINOPHEN 325 MG PO TABS: 650.0000 mg | ORAL_TABLET | ORAL | Status: DC | PRN

## 2019-03-21 NOTE — ED Provider Notes (Signed)
[facility] EMERGENCY DEPARTMENT Provider Note   CSN: [id number] Arrival date & time: 03/21/19  1455     History   Chief Complaint Chief Complaint  Patient presents with  . Tachycardia    HPI RHET RORKE is a 33 y.o. male.     Patient complains of chest discomfort.  This started while he was playing with his daughter.  He was also having some shortness of breath  The history is provided by the patient. No language interpreter was used.  Chest Pain Pain location:  L chest Pain quality: aching   Pain radiates to:  Does not radiate Pain severity:  Moderate Onset quality:  Sudden Timing:  Constant Progression:  Worsening Chronicity:  New Context: not breathing   Relieved by:  Nothing Associated symptoms: palpitations and shortness of breath   Associated symptoms: no abdominal pain, no back pain, no cough, no fatigue and no headache     History reviewed. No pertinent past medical history.  Patient Active Problem List   Diagnosis Date Noted  . Atrial fibrillation with RVR ([place]) 03/21/2019    History reviewed. No pertinent surgical history.      Home Medications    Prior to Admission medications   Medication Sig Start Date End Date Taking? Authorizing Provider  aspirin 325 MG EC tablet Take 650 mg by mouth once.   Yes [provider]    Family History History reviewed. No pertinent family history.  Social History Social History   Tobacco Use  . Smoking status: Former Research scientist (life sciences)  . Smokeless tobacco: Never Used  Substance Use Topics  . Alcohol use: Yes    Comment: Rarely-once monthly  . Drug use: Never     Allergies   Penicillins   Review of Systems Review of Systems  Constitutional: Negative for appetite change and fatigue.  HENT: Negative for congestion, ear discharge and sinus pressure.   Eyes: Negative for discharge.  Respiratory: Positive for shortness of breath. Negative for cough.   Cardiovascular: Positive for chest pain and  palpitations.  Gastrointestinal: Negative for abdominal pain and diarrhea.  Genitourinary: Negative for frequency and hematuria.  Musculoskeletal: Negative for back pain.  Skin: Negative for rash.  Neurological: Negative for seizures and headaches.  Psychiatric/Behavioral: Negative for hallucinations.     Physical Exam Updated Vital Signs BP 115/81   Pulse 84   Temp 99.3 F (37.4 C) (Oral)   Resp 12   Ht 5\' 11"  (1.803 m)   Wt (!) 158.8 kg   SpO2 98%   BMI 48.82 kg/m   Physical Exam Vitals signs and nursing note reviewed.  Constitutional:      Appearance: He is well-developed.  HENT:     Head: Normocephalic.     Nose: Nose normal.  Eyes:     General: No scleral icterus.    Conjunctiva/sclera: Conjunctivae normal.  Neck:     Musculoskeletal: Neck supple.     Thyroid: No thyromegaly.  Cardiovascular:     Heart sounds: No murmur. No friction rub. No gallop.      Comments: Irregular rapid rate Pulmonary:     Breath sounds: No stridor. No wheezing or rales.  Chest:     Chest wall: No tenderness.  Abdominal:     General: There is no distension.     Tenderness: There is no abdominal tenderness. There is no rebound.  Musculoskeletal: Normal range of motion.  Lymphadenopathy:     Cervical: No cervical adenopathy.  Skin:    Findings:  No erythema or rash.  Neurological:     Mental Status: He is oriented to person, place, and time.     Motor: No abnormal muscle tone.     Coordination: Coordination normal.  Psychiatric:        Behavior: Behavior normal.      ED Treatments / Results  Labs (all labs ordered are listed, but only abnormal results are displayed) Labs Reviewed  COMPREHENSIVE METABOLIC PANEL - Abnormal; Notable for the following components:      Result Value   Glucose, Bld 110 (*)    Calcium 8.6 (*)    All other components within normal limits  SARS CORONAVIRUS 2 (HOSPITAL ORDER, PERFORMED IN  HOSPITAL LAB)  CBC WITH DIFFERENTIAL/PLATELET   TROPONIN I (HIGH SENSITIVITY)  TROPONIN I (HIGH SENSITIVITY)    EKG None  Radiology Dg Chest Portable 1 View  Result Date: 03/21/2019 CLINICAL DATA:  Tachycardia and increased chest pressure today EXAM: PORTABLE CHEST 1 VIEW COMPARISON:  September 17, 2016 FINDINGS: The heart size and mediastinal contours are within normal limits. The lung volumes are low. Both lungs are clear. The visualized skeletal structures are unremarkable. IMPRESSION: No active cardiopulmonary disease. Electronically Signed   By: Sherian ReinWei-Chen  Lin M.D.   On: 03/21/2019 16:29    Procedures Procedures (including critical care time)  Medications Ordered in ED Medications  diltiazem (CARDIZEM) 100 mg in dextrose 5 % 100 mL (1 mg/mL) infusion (5 mg/hr Intravenous New Bag/Given 03/21/19 1538)  diltiazem (CARDIZEM) injection 10 mg (10 mg Intravenous Given 03/21/19 1536)     Initial Impression / Assessment and Plan / ED Course  I have reviewed the triage vital signs and the nursing notes.  Pertinent labs & imaging results that were available during my care of the patient were reviewed by me and considered in my medical decision making (see chart for details).        CRITICAL CARE Performed by: Bethann BerkshireJoseph Kenly Henckel Total critical care time:3145minutes Critical care time was exclusive of separately billable procedures and treating other patients. Critical care was necessary to treat or prevent imminent or life-threatening deterioration. Critical care was time spent personally by me on the following activities: development of treatment plan with patient and/or surrogate as well as nursing, discussions with consultants, evaluation of patient's response to treatment, examination of patient, obtaining history from patient or surrogate, ordering and performing treatments and interventions, ordering and review of laboratory studies, ordering and review of radiographic studies, pulse oximetry and re-evaluation of patient's condition.   Patient with rapid atrial fib.  Be admitted to medicine.  Patient responding to Cardizem drip Final Clinical Impressions(s) / ED Diagnoses   Final diagnoses:  Atrial fibrillation with RVR [facility](HCC)    ED Discharge Orders    None       Bethann BerkshireZammit, Shevon Sian, MD 03/21/19 1731

## 2019-03-21 NOTE — H&P (Signed)
History and Physical  CLEARANCE CHENAULT QIH:[id number] DOB: 29-Jun-1986 DOA: 03/21/2019  Referring physician: Dr Roderic Palau, ED physician PCP: Patient, No Pcp Per  Outpatient Specialists:   Patient Coming From: Home  Chief Complaint: Chest discomfort  HPI: Karl Nicholson is a 33 y.o. male with a history of obesity.  Patient presents with a sudden onset of chest discomfort that happened earlier today.  Patient attempted to rest but this did not improve his symptoms.  Was having some shortness of breath during the episode.  He came to the hospital for evaluation and was found to be in A. fib with RVR.  No palliating or provoking factors other than the patient feels better with medication to reduce his heart rate.  Symptoms are now improving.  Emergency Department Course: Telemetry shows A. fib with RVR.  Patient is continuing to be in A. fib and is slightly still tachycardic.  Troponin negative.  Review of Systems:   Pt denies any fevers, chills, nausea, vomiting, diarrhea, constipation, abdominal pain, shortness of breath, dyspnea on exertion, orthopnea, cough, wheezing, palpitations, headache, vision changes, lightheadedness, dizziness, melena, rectal bleeding.  Review of systems are otherwise negative  History reviewed. No pertinent past medical history. History reviewed. No pertinent surgical history. Social History:  reports that he has quit smoking. He has never used smokeless tobacco. He reports current alcohol use. He reports that he does not use drugs. Patient lives at home  Allergies  Allergen Reactions  . Penicillins Other (See Comments)    Did it involve swelling of the face/tongue/throat, SOB, or low BP? unknown Did it involve sudden or severe rash/hives, skin peeling, or any reaction on the inside of your mouth or nose? unknown Did you need to seek medical attention at a hospital or doctor's office? unknown When did it last happen?childhood If all above answers are "NO",  may proceed with cephalosporin use.     History reviewed. No pertinent family history.  Family history of cardiac disease in a grandfather with congestive heart failure COPD.  Prior to Admission medications   Medication Sig Start Date End Date Taking? Authorizing Provider  aspirin 325 MG EC tablet Take 650 mg by mouth once.   Yes [provider]    Physical Exam: BP 115/81   Pulse 84   Temp 99.3 F (37.4 C) (Oral)   Resp 12   Ht 5\' 11"  (1.803 m)   Wt (!) 158.8 kg   SpO2 98%   BMI 48.82 kg/m   . General: [place] Caucasian male. Awake and alert and oriented x3. No acute cardiopulmonary distress.  Marland Kitchen HEENT: Normocephalic atraumatic.  Right and left ears normal in appearance.  Pupils equal, round, reactive to light. Extraocular muscles are intact. Sclerae anicteric and noninjected.  Moist mucosal membranes. No mucosal lesions.  . Neck: Neck supple without lymphadenopathy. No carotid bruits. No masses palpated.  . Cardiovascular: Tachycardic irregularly irregular rate. No murmurs, rubs, gallops auscultated. No JVD.  Marland Kitchen Respiratory: Good respiratory effort with no wheezes, rales, rhonchi. Lungs clear to auscultation bilaterally.  No accessory muscle use. . Abdomen: Soft, nontender, nondistended. Active bowel sounds. No masses or hepatosplenomegaly  . Skin: No rashes, lesions, or ulcerations.  Dry, warm to touch. 2+ dorsalis pedis and radial pulses. . Musculoskeletal: No calf or leg pain. All major joints not erythematous nontender.  No upper or lower joint deformation.  Good ROM.  No contractures  . Psychiatric: Intact judgment and insight. Pleasant and cooperative. . Neurologic: No focal neurological deficits.  Strength is 5/5 and symmetric in upper and lower extremities.  Cranial nerves II through XII are grossly intact.           Labs on Admission: I have personally reviewed following labs and imaging studies  CBC: Recent Labs  Lab 03/21/19 1513  WBC 10.2  NEUTROABS 5.8   HGB 16.4  HCT 48.6  MCV 84.1  PLT 267   Basic Metabolic Panel: Recent Labs  Lab 03/21/19 1513  NA 140  K 3.7  CL 105  CO2 23  GLUCOSE 110*  BUN 10  CREATININE 0.95  CALCIUM 8.6*   GFR: Estimated Creatinine Clearance: 168.5 mL/min (by C-G formula based on SCr of 0.95 mg/dL). Liver Function Tests: Recent Labs  Lab 03/21/19 1513  AST 25  ALT 34  ALKPHOS 62  BILITOT 0.3  PROT 6.6  ALBUMIN 3.9   No results for input(s): LIPASE, AMYLASE in the last 168 hours. No results for input(s): AMMONIA in the last 168 hours. Coagulation Profile: No results for input(s): INR, PROTIME in the last 168 hours. Cardiac Enzymes: No results for input(s): CKTOTAL, CKMB, CKMBINDEX, TROPONINI in the last 168 hours. BNP (last 3 results) No results for input(s): PROBNP in the last 8760 hours. HbA1C: No results for input(s): HGBA1C in the last 72 hours. CBG: No results for input(s): GLUCAP in the last 168 hours. Lipid Profile: No results for input(s): CHOL, HDL, LDLCALC, TRIG, CHOLHDL, LDLDIRECT in the last 72 hours. Thyroid Function Tests: No results for input(s): TSH, T4TOTAL, FREET4, T3FREE, THYROIDAB in the last 72 hours. Anemia Panel: No results for input(s): VITAMINB12, FOLATE, FERRITIN, TIBC, IRON, RETICCTPCT in the last 72 hours. Urine analysis:    Component Value Date/Time   COLORURINE YELLOW (A) 09/17/2017 1814   APPEARANCEUR CLEAR (A) 09/17/2017 1814   LABSPEC 1.020 09/17/2017 1814   PHURINE 7.0 09/17/2017 1814   GLUCOSEU NEGATIVE 09/17/2017 1814   HGBUR NEGATIVE 09/17/2017 1814   BILIRUBINUR NEGATIVE 09/17/2017 1814   KETONESUR NEGATIVE 09/17/2017 1814   PROTEINUR NEGATIVE 09/17/2017 1814   NITRITE NEGATIVE 09/17/2017 1814   LEUKOCYTESUR NEGATIVE 09/17/2017 1814   Sepsis Labs: @LABRCNTIP (procalcitonin:4,lacticidven:4) )No results found for this or any previous visit (from the past 240 hour(s)).   Radiological Exams on Admission: Dg Chest Portable 1 View  Result  Date: 03/21/2019 CLINICAL DATA:  Tachycardia and increased chest pressure today EXAM: PORTABLE CHEST 1 VIEW COMPARISON:  September 17, 2016 FINDINGS: The heart size and mediastinal contours are within normal limits. The lung volumes are low. Both lungs are clear. The visualized skeletal structures are unremarkable. IMPRESSION: No active cardiopulmonary disease. Electronically Signed   By: Sherian ReinWei-Chen  Lin M.D.   On: 03/21/2019 16:29    EKG: Independently reviewed.  Pending  Assessment/Plan: Active Problems:   Atrial fibrillation with RVR (HCC)    This patient was discussed with the ED physician, including pertinent vitals, physical exam findings, labs, and imaging.  We also discussed care given by the ED provider.  1. A. fib with RVR a. Observation b. Echocardiogram in the morning c. Telemetry monitoring d. Continue Cardizem drip e. TSH, hemoglobin A1c, anticoagulation studies f. Start Eliquis  DVT prophylaxis: Full anticoagulation Consultants: None Code Status: Full code Family Communication: None Disposition Plan: Patient should be able to return home following improvement of RVR   Levie HeritageStinson, Darriana Deboy J, DO

## 2019-03-21 NOTE — ED Triage Notes (Signed)
Pt brought in by ems after having sudden onset of chest pressure with tachycardia at home today. Was given Adenocard 6, 12, 12 without change. EKG by ems shows a fib.

## 2019-03-22 ENCOUNTER — Observation Stay (HOSPITAL_BASED_OUTPATIENT_CLINIC_OR_DEPARTMENT_OTHER): Payer: Self-pay

## 2019-03-22 DIAGNOSIS — I4891 Unspecified atrial fibrillation: Secondary | ICD-10-CM

## 2019-03-22 LAB — HEMOGLOBIN A1C
Hgb A1c MFr Bld: 5.4 % (ref 4.8–5.6)
Mean Plasma Glucose: 108.28 mg/dL

## 2019-03-22 LAB — BASIC METABOLIC PANEL
Anion gap: 9 (ref 5–15)
BUN: 8 mg/dL (ref 6–20)
CO2: 21 mmol/L — ABNORMAL LOW (ref 22–32)
Calcium: 8.7 mg/dL — ABNORMAL LOW (ref 8.9–10.3)
Chloride: 107 mmol/L (ref 98–111)
Creatinine, Ser: 0.96 mg/dL (ref 0.61–1.24)
GFR calc Af Amer: >60 mL/min (ref >60)
GFR calc non Af Amer: >60 mL/min (ref >60)
Glucose, Bld: 107 mg/dL — ABNORMAL HIGH (ref 70–99)
Potassium: 3.9 mmol/L (ref 3.5–5.1)
Sodium: 137 mmol/L (ref 135–145)

## 2019-03-22 LAB — ECHOCARDIOGRAM COMPLETE
Height: 71 in
Weight: 5600 oz

## 2019-03-22 LAB — TROPONIN I (HIGH SENSITIVITY): Troponin I (High Sensitivity): 8 ng/L (ref <18)

## 2019-03-22 LAB — MRSA PCR SCREENING: MRSA by PCR: NEGATIVE

## 2019-03-22 MED ORDER — DILTIAZEM HCL 30 MG PO TABS
30.0000 mg | ORAL_TABLET | Freq: Four times a day (QID) | ORAL | Status: DC
  Administered 2019-03-22 (×2): 30 mg via ORAL
  Filled 2019-03-22 (×2): qty 1

## 2019-03-22 MED ORDER — DILTIAZEM HCL ER COATED BEADS 120 MG PO CP24: 120.0000 mg | ORAL_CAPSULE | Freq: Every day | ORAL | 3 refills | Status: DC

## 2019-03-22 MED ORDER — ASPIRIN EC 81 MG PO TBEC: 81.0000 mg | DELAYED_RELEASE_TABLET | Freq: Every day | ORAL | 2 refills | Status: AC

## 2019-03-22 NOTE — Progress Notes (Signed)
Pt transferred to private vehicle via w/c. VSS at time of discharge. AVS paperwork reviewed and given to pt. Voiced no questions or concerns. Pt belongings on person at time of d/c. Will f/u with Cardiology within 1 week. PIVs x2 removed. Pt tolerated well.

## 2019-03-22 NOTE — Progress Notes (Signed)
PROGRESS NOTE    Karl Nicholson  KKX:[id number] DOB: 11/03/1985 DOA: 03/21/2019 PCP: Patient, No Pcp Per   Brief Narrative:  Per HPI: Karl Nicholson is a 33 y.o. male with a history of obesity.  Patient presents with a sudden onset of chest discomfort that happened earlier today.  Patient attempted to rest but this did not improve his symptoms.  Was having some shortness of breath during the episode.  He came to the hospital for evaluation and was found to be in A. fib with RVR.  No palliating or provoking factors other than the patient feels better with medication to reduce his heart rate.  Symptoms are now improving.  Patient was admitted with new onset atrial fibrillation with RVR.  He was started on Cardizem IV drip and appears to have better heart rate control this morning and no further chest pain.  He was started on Eliquis, however his chads vasc appears to be low.  Assessment & Plan:   Active Problems:   Atrial fibrillation with RVR (HCC)   New onset atrial fibrillation with RVR -TSH noted to be 3.3 -2D echocardiogram pending as well as hemoglobin A1c -Likely transition Eliquis to aspirin by tomorrow, will check with cardiology -Transition off IV Cardizem to oral today and monitor heart rates  Prior history of alcohol use -Patient has slowed down recently with alcohol use, but this is likely contributing factor to above -We will order toxicology screen  Obesity -We will need lifestyle management in outpatient setting   DVT prophylaxis: Eliquis Code Status: Full Family Communication: None at bedside Disposition Plan: Anticipate discharge in a.m. if heart rate remained stable.  Plan to check echocardiogram and review with cardiology.   Consultants:   None  Procedures:   None  Antimicrobials:   None   Subjective: Patient seen and evaluated today with no new acute complaints or concerns. No acute concerns or events noted overnight.  He denies any further  chest pain and has improved heart rate control.  Objective: Vitals:   03/22/19 0408 03/22/19 0500 03/22/19 0629 03/22/19 0735  BP: 103/60 117/80 (!) 116/92   Pulse: (!) 101 (!) 54 70 (!) 108  Resp: 19 18 16 10   Temp: 98.3 F (36.8 C)   97.8 F (36.6 C)  TempSrc: Oral   Oral  SpO2: 97% 98% 98% 98%  Weight:      Height:        Intake/Output Summary (Last 24 hours) at 03/22/2019 0815 Last data filed at 03/22/2019 0500 Gross per 24 hour  Intake 416.27 ml  Output 1100 ml  Net -683.73 ml   Filed Weights   03/21/19 1505  Weight: (!) 158.8 kg    Examination:  General exam: Appears calm and comfortable, obese Respiratory system: Clear to auscultation. Respiratory effort normal. Cardiovascular system: S1 & S2 heard, irregular and tachycardic. No JVD, murmurs, rubs, gallops or clicks. No pedal edema. Gastrointestinal system: Abdomen is nondistended, soft and nontender. No organomegaly or masses felt. Normal bowel sounds heard. Central nervous system: Alert and oriented. No focal neurological deficits. Extremities: Symmetric 5 x 5 power. Skin: No rashes, lesions or ulcers Psychiatry: Judgement and insight appear normal. Mood & affect appropriate.     Data Reviewed: I have personally reviewed following labs and imaging studies  CBC: Recent Labs  Lab 03/21/19 1513  WBC 10.2  NEUTROABS 5.8  HGB 16.4  HCT 48.6  MCV 84.1  PLT 169   Basic Metabolic Panel: Recent Labs  Lab  03/21/19 1513 03/21/19 2211 03/22/19 0620  NA 140  --  137  K 3.7  --  3.9  CL 105  --  107  CO2 23  --  21*  GLUCOSE 110*  --  107*  BUN 10  --  8  CREATININE 0.95  --  0.96  CALCIUM 8.6*  --  8.7*  MG  --  2.2  --    GFR: Estimated Creatinine Clearance: 166.7 mL/min (by C-G formula based on SCr of 0.96 mg/dL). Liver Function Tests: Recent Labs  Lab 03/21/19 1513  AST 25  ALT 34  ALKPHOS 62  BILITOT 0.3  PROT 6.6  ALBUMIN 3.9   No results for input(s): LIPASE, AMYLASE in the last 168  hours. No results for input(s): AMMONIA in the last 168 hours. Coagulation Profile: Recent Labs  Lab 03/21/19 2211  INR 1.0   Cardiac Enzymes: No results for input(s): CKTOTAL, CKMB, CKMBINDEX, TROPONINI in the last 168 hours. BNP (last 3 results) No results for input(s): PROBNP in the last 8760 hours. HbA1C: No results for input(s): HGBA1C in the last 72 hours. CBG: No results for input(s): GLUCAP in the last 168 hours. Lipid Profile: No results for input(s): CHOL, HDL, LDLCALC, TRIG, CHOLHDL, LDLDIRECT in the last 72 hours. Thyroid Function Tests: Recent Labs    03/21/19 2211  TSH 3.317   Anemia Panel: No results for input(s): VITAMINB12, FOLATE, FERRITIN, TIBC, IRON, RETICCTPCT in the last 72 hours. Sepsis Labs: No results for input(s): PROCALCITON, LATICACIDVEN in the last 168 hours.  Recent Results (from the past 240 hour(s))  SARS Coronavirus 2 (CEPHEID- Performed in [facility] lab), Hosp Order     Status: None   Collection Time: 03/21/19  5:15 PM   Specimen: Nasopharyngeal Swab  Result Value Ref Range Status   SARS Coronavirus 2 NEGATIVE NEGATIVE Final    Comment: (NOTE) If result is NEGATIVE SARS-CoV-2 target nucleic acids are NOT DETECTED. The SARS-CoV-2 RNA is generally detectable in upper and lower  respiratory specimens during the acute phase of infection. The lowest  concentration of SARS-CoV-2 viral copies this assay can detect is 250  copies / mL. A negative result does not preclude SARS-CoV-2 infection  and should not be used as the sole basis for treatment or other  patient management decisions.  A negative result may occur with  improper specimen collection / handling, submission of specimen other  than nasopharyngeal swab, presence of viral mutation(s) within the  areas targeted by this assay, and inadequate number of viral copies  (<250 copies / mL). A negative result must be combined with clinical  observations, patient history, and  epidemiological information. If result is POSITIVE SARS-CoV-2 target nucleic acids are DETECTED. The SARS-CoV-2 RNA is generally detectable in upper and lower  respiratory specimens dur ing the acute phase of infection.  Positive  results are indicative of active infection with SARS-CoV-2.  Clinical  correlation with patient history and other diagnostic information is  necessary to determine patient infection status.  Positive results do  not rule out bacterial infection or co-infection with other viruses. If result is PRESUMPTIVE POSTIVE SARS-CoV-2 nucleic acids MAY BE PRESENT.   A presumptive positive result was obtained on the submitted specimen  and confirmed on repeat testing.  While 2019 novel coronavirus  (SARS-CoV-2) nucleic acids may be present in the submitted sample  additional confirmatory testing may be necessary for epidemiological  and / or clinical management purposes  to differentiate between  SARS-CoV-2  and other Sarbecovirus currently known to infect humans.  If clinically indicated additional testing with an alternate test  methodology [phone]) is advised. The SARS-CoV-2 RNA is generally  detectable in upper and lower respiratory sp ecimens during the acute  phase of infection. The expected result is Negative. Fact Sheet for Patients:  BoilerBrush.com.cyhttps://www.fda.gov/media/136312/download Fact Sheet for Healthcare Providers: https://pope.com/https://www.fda.gov/media/136313/download This test is not yet approved or cleared by the [place] FDA and has been authorized for detection and/or diagnosis of SARS-CoV-2 by FDA under an Emergency Use Authorization (EUA).  This EUA will remain in effect (meaning this test can be used) for the duration of the COVID-19 declaration under Section 564(b)(1) of the Act, 21 U.S.C. section 360bbb-3(b)(1), unless the authorization is terminated or revoked sooner. Performed at [facility], [address] [place], [place] [address]   MRSA PCR Screening      Status: None   Collection Time: 03/21/19  8:02 PM   Specimen: Nasal Mucosa; Nasopharyngeal  Result Value Ref Range Status   MRSA by PCR NEGATIVE NEGATIVE Final    Comment:        The GeneXpert MRSA Assay (FDA approved for NASAL specimens only), is one component of a comprehensive MRSA colonization surveillance program. It is not intended to diagnose MRSA infection nor to guide or monitor treatment for MRSA infections. Performed at [facility], [address] [place], [place] [address]          Radiology Studies: Dg Chest Portable 1 View  Result Date: 03/21/2019 CLINICAL DATA:  Tachycardia and increased chest pressure today EXAM: PORTABLE CHEST 1 VIEW COMPARISON:  September 17, 2016 FINDINGS: The heart size and mediastinal contours are within normal limits. The lung volumes are low. Both lungs are clear. The visualized skeletal structures are unremarkable. IMPRESSION: No active cardiopulmonary disease. Electronically Signed   By: Sherian ReinWei-Chen  Lin M.D.   On: 03/21/2019 16:29        Scheduled Meds: . apixaban  5 mg Oral BID  . diltiazem  30 mg Oral Q6H   Continuous Infusions:   LOS: 0 days    Time spent: 30 minutes    Nalaya Wojdyla Hoover Brunette Holy B[place], DO [facility] Pager [phone]  If 7PM-7AM, please contact night-coverage [email or web address] Password [email or web address] 03/22/2019, 8:15 AM

## 2019-03-22 NOTE — Discharge Summary (Signed)
Physician Discharge Summary  WISDOM RICKEY ALP:[id number] DOB: 1985-10-04 DOA: 03/21/2019  PCP: Patient, No Pcp Per  Admit date: 03/21/2019  Discharge date: 03/22/2019  Admitted From:Home  Disposition:  Home  Recommendations for Outpatient Follow-up:  1. Follow up with PCP in 1-2 weeks 2. Follow-up with Dr. Harl Bowie of cardiology in 1 to 2 weeks which will be scheduled to evaluate new onset atrial fibrillation 3. Continue on Cardizem 120 mg p.o. as prescribed starting 7/6 and remain on aspirin 81 mg daily for now  Home Health: None  Equipment/Devices: None  Discharge Condition: Stable  CODE STATUS: Full  Diet recommendation: Heart Healthy  Brief/Interim Summary: Per HPI: Sasha Rueth Claridais a 33 y.o.malewith a history of obesity. Patient presents with a sudden onset of chest discomfort that happened earlier today. Patient attempted to rest but this did not improve his symptoms. Was having some shortness of breath during the episode. He came to the hospital for evaluation and was found to be in A. fib with RVR. No palliating or provoking factors other than the patient feels better with medication to reduce his heart rate. Symptoms are now improving.  Patient was admitted with new onset atrial fibrillation with RVR.  He was started on Cardizem IV drip and transition to oral Cardizem when he spontaneously converted to sinus rhythm.  His heart rate is well controlled between 70 to 80 bpm and his chads vasc score is 0.  He is no longer symptomatic and appears to be stable for discharge.  I will keep him on Cardizem 120 mg p.o. CD for now as well as aspirin 81 mg daily until further follow-up to cardiology in the next 1 to 2 weeks which will be scheduled.  No other acute events noted during the course of this brief admission.  He is recommended to stay off alcohol and to work on weight loss.  Discharge Diagnoses:  Active Problems:   Atrial fibrillation with RVR ([place])  Principal  discharge diagnosis: New onset atrial fibrillation with RVR.  Discharge Instructions  Discharge Instructions    Diet - low sodium heart healthy   Complete by: As directed    Increase activity slowly   Complete by: As directed      Allergies as of 03/22/2019      Reactions   Penicillins Other (See Comments)   Did it involve swelling of the face/tongue/throat, SOB, or low BP? unknown Did it involve sudden or severe rash/hives, skin peeling, or any reaction on the inside of your mouth or nose? unknown Did you need to seek medical attention at a hospital or doctor's office? unknown When did it last happen?childhood If all above answers are "NO", may proceed with cephalosporin use.      Medication List    TAKE these medications   aspirin EC 81 MG tablet Take 1 tablet (81 mg total) by mouth daily. What changed:   medication strength  how much to take  when to take this   diltiazem 120 MG 24 hr capsule Commonly known as: Cardizem CD Take 1 capsule (120 mg total) by mouth daily. Start taking on: March 23, 2019      Follow-up Information    Arnoldo Lenis, MD Follow up in 1 week(s).   Specialty: Cardiology Contact information: [place] [address] [phone]          Allergies  Allergen Reactions  . Penicillins Other (See Comments)    Did it involve swelling of the face/tongue/throat,  SOB, or low BP? unknown Did it involve sudden or severe rash/hives, skin peeling, or any reaction on the inside of your mouth or nose? unknown Did you need to seek medical attention at a hospital or doctor's office? unknown When did it last happen?childhood If all above answers are "NO", may proceed with cephalosporin use.     Consultations:  None   Procedures/Studies: Dg Chest Portable 1 View  Result Date: 03/21/2019 CLINICAL DATA:  Tachycardia and increased chest pressure today EXAM: PORTABLE CHEST 1 VIEW COMPARISON:  September 17, 2016  FINDINGS: The heart size and mediastinal contours are within normal limits. The lung volumes are low. Both lungs are clear. The visualized skeletal structures are unremarkable. IMPRESSION: No active cardiopulmonary disease. Electronically Signed   By: Sherian Rein M.D.   On: 03/21/2019 16:29      Discharge Exam: Vitals:   03/22/19 1130 03/22/19 1200  BP:  (!) 134/92  Pulse: 79   Resp: 15   Temp: 98.2 F (36.8 C)   SpO2: 99%    Vitals:   03/22/19 1000 03/22/19 1100 03/22/19 1130 03/22/19 1200  BP:    (!) 134/92  Pulse: 79 75 79   Resp: 20 (!) 23 15   Temp:   98.2 F (36.8 C)   TempSrc:   Oral   SpO2: 98% 97% 99%   Weight:      Height:        General: Pt is alert, awake, not in acute distress Cardiovascular: RRR, S1/S2 +, no rubs, no gallops Respiratory: CTA bilaterally, no wheezing, no rhonchi Abdominal: Soft, NT, ND, bowel sounds + Extremities: no edema, no cyanosis    The results of significant diagnostics from this hospitalization (including imaging, microbiology, ancillary and laboratory) are listed below for reference.     Microbiology: Recent Results (from the past 240 hour(s))  SARS Coronavirus 2 (CEPHEID- Performed in [facility] hospital lab), Hosp Order     Status: None   Collection Time: 03/21/19  5:15 PM   Specimen: Nasopharyngeal Swab  Result Value Ref Range Status   SARS Coronavirus 2 NEGATIVE NEGATIVE Final    Comment: (NOTE) If result is NEGATIVE SARS-CoV-2 target nucleic acids are NOT DETECTED. The SARS-CoV-2 RNA is generally detectable in upper and lower  respiratory specimens during the acute phase of infection. The lowest  concentration of SARS-CoV-2 viral copies this assay can detect is 250  copies / mL. A negative result does not preclude SARS-CoV-2 infection  and should not be used as the sole basis for treatment or other  patient management decisions.  A negative result may occur with  improper specimen collection / handling, submission  of specimen other  than nasopharyngeal swab, presence of viral mutation(s) within the  areas targeted by this assay, and inadequate number of viral copies  (<250 copies / mL). A negative result must be combined with clinical  observations, patient history, and epidemiological information. If result is POSITIVE SARS-CoV-2 target nucleic acids are DETECTED. The SARS-CoV-2 RNA is generally detectable in upper and lower  respiratory specimens dur ing the acute phase of infection.  Positive  results are indicative of active infection with SARS-CoV-2.  Clinical  correlation with patient history and other diagnostic information is  necessary to determine patient infection status.  Positive results do  not rule out bacterial infection or co-infection with other viruses. If result is PRESUMPTIVE POSTIVE SARS-CoV-2 nucleic acids MAY BE PRESENT.   A presumptive positive result was obtained on the submitted specimen  and confirmed on repeat testing.  While 2019 novel coronavirus  (SARS-CoV-2) nucleic acids may be present in the submitted sample  additional confirmatory testing may be necessary for epidemiological  and / or clinical management purposes  to differentiate between  SARS-CoV-2 and other Sarbecovirus currently known to infect humans.  If clinically indicated additional testing with an alternate test  methodology [phone]) is advised. The SARS-CoV-2 RNA is generally  detectable in upper and lower respiratory sp ecimens during the acute  phase of infection. The expected result is Negative. Fact Sheet for Patients:  BoilerBrush.com.cyhttps://www.fda.gov/media/136312/download Fact Sheet for Healthcare Providers: https://pope.com/https://www.fda.gov/media/136313/download This test is not yet approved or cleared by the [place] FDA and has been authorized for detection and/or diagnosis of SARS-CoV-2 by FDA under an Emergency Use Authorization (EUA).  This EUA will remain in effect (meaning this test can be used) for  the duration of the COVID-19 declaration under Section 564(b)(1) of the Act, 21 U.S.C. section 360bbb-3(b)(1), unless the authorization is terminated or revoked sooner. Performed at [facility], [address] [place], [place] [address]   MRSA PCR Screening     Status: None   Collection Time: 03/21/19  8:02 PM   Specimen: Nasal Mucosa; Nasopharyngeal  Result Value Ref Range Status   MRSA by PCR NEGATIVE NEGATIVE Final    Comment:        The GeneXpert MRSA Assay (FDA approved for NASAL specimens only), is one component of a comprehensive MRSA colonization surveillance program. It is not intended to diagnose MRSA infection nor to guide or monitor treatment for MRSA infections. Performed at [facility], [address] [place], [place] [address]      Labs: BNP (last 3 results) No results for input(s): BNP in the last 8760 hours. Basic Metabolic Panel: Recent Labs  Lab 03/21/19 1513 03/21/19 2211 03/22/19 0620  NA 140  --  137  K 3.7  --  3.9  CL 105  --  107  CO2 23  --  21*  GLUCOSE 110*  --  107*  BUN 10  --  8  CREATININE 0.95  --  0.96  CALCIUM 8.6*  --  8.7*  MG  --  2.2  --    Liver Function Tests: Recent Labs  Lab 03/21/19 1513  AST 25  ALT 34  ALKPHOS 62  BILITOT 0.3  PROT 6.6  ALBUMIN 3.9   No results for input(s): LIPASE, AMYLASE in the last 168 hours. No results for input(s): AMMONIA in the last 168 hours. CBC: Recent Labs  Lab 03/21/19 1513  WBC 10.2  NEUTROABS 5.8  HGB 16.4  HCT 48.6  MCV 84.1  PLT 267   Cardiac Enzymes: No results for input(s): CKTOTAL, CKMB, CKMBINDEX, TROPONINI in the last 168 hours. BNP: Invalid input(s): POCBNP CBG: No results for input(s): GLUCAP in the last 168 hours. D-Dimer No results for input(s): DDIMER in the last 72 hours. Hgb A1c Recent Labs    03/21/19 2211  HGBA1C 5.4   Lipid Profile No results for input(s): CHOL, HDL, LDLCALC, TRIG, CHOLHDL, LDLDIRECT in the last 72 hours. Thyroid function  studies Recent Labs    03/21/19 2211  TSH 3.317   Anemia work up No results for input(s): VITAMINB12, FOLATE, FERRITIN, TIBC, IRON, RETICCTPCT in the last 72 hours. Urinalysis    Component Value Date/Time   COLORURINE YELLOW (A) 09/17/2017 1814   APPEARANCEUR CLEAR (A) 09/17/2017 1814   LABSPEC 1.020 09/17/2017 1814   PHURINE 7.0 09/17/2017 1814  GLUCOSEU NEGATIVE 09/17/2017 1814   HGBUR NEGATIVE 09/17/2017 1814   BILIRUBINUR NEGATIVE 09/17/2017 1814   KETONESUR NEGATIVE 09/17/2017 1814   PROTEINUR NEGATIVE 09/17/2017 1814   NITRITE NEGATIVE 09/17/2017 1814   LEUKOCYTESUR NEGATIVE 09/17/2017 1814   Sepsis Labs Invalid input(s): PROCALCITONIN,  WBC,  LACTICIDVEN Microbiology Recent Results (from the past 240 hour(s))  SARS Coronavirus 2 (CEPHEID- Performed in [facility] hospital lab), Hosp Order     Status: None   Collection Time: 03/21/19  5:15 PM   Specimen: Nasopharyngeal Swab  Result Value Ref Range Status   SARS Coronavirus 2 NEGATIVE NEGATIVE Final    Comment: (NOTE) If result is NEGATIVE SARS-CoV-2 target nucleic acids are NOT DETECTED. The SARS-CoV-2 RNA is generally detectable in upper and lower  respiratory specimens during the acute phase of infection. The lowest  concentration of SARS-CoV-2 viral copies this assay can detect is 250  copies / mL. A negative result does not preclude SARS-CoV-2 infection  and should not be used as the sole basis for treatment or other  patient management decisions.  A negative result may occur with  improper specimen collection / handling, submission of specimen other  than nasopharyngeal swab, presence of viral mutation(s) within the  areas targeted by this assay, and inadequate number of viral copies  (<250 copies / mL). A negative result must be combined with clinical  observations, patient history, and epidemiological information. If result is POSITIVE SARS-CoV-2 target nucleic acids are DETECTED. The SARS-CoV-2 RNA is  generally detectable in upper and lower  respiratory specimens dur ing the acute phase of infection.  Positive  results are indicative of active infection with SARS-CoV-2.  Clinical  correlation with patient history and other diagnostic information is  necessary to determine patient infection status.  Positive results do  not rule out bacterial infection or co-infection with other viruses. If result is PRESUMPTIVE POSTIVE SARS-CoV-2 nucleic acids MAY BE PRESENT.   A presumptive positive result was obtained on the submitted specimen  and confirmed on repeat testing.  While 2019 novel coronavirus  (SARS-CoV-2) nucleic acids may be present in the submitted sample  additional confirmatory testing may be necessary for epidemiological  and / or clinical management purposes  to differentiate between  SARS-CoV-2 and other Sarbecovirus currently known to infect humans.  If clinically indicated additional testing with an alternate test  methodology [phone]([id number]) is advised. The SARS-CoV-2 RNA is generally  detectable in upper and lower respiratory sp ecimens during the acute  phase of infection. The expected result is Negative. Fact Sheet for Patients:  BoilerBrush.com.cyhttps://www.fda.gov/media/136312/download Fact Sheet for Healthcare Providers: https://pope.com/https://www.fda.gov/media/136313/download This test is not yet approved or cleared by the [place] FDA and has been authorized for detection and/or diagnosis of SARS-CoV-2 by FDA under an Emergency Use Authorization (EUA).  This EUA will remain in effect (meaning this test can be used) for the duration of the COVID-19 declaration under Section 564(b)(1) of the Act, 21 U.S.C. section 360bbb-3(b)(1), unless the authorization is terminated or revoked sooner. Performed at [facility], [address] [place], [place] [address]   MRSA PCR Screening     Status: None   Collection Time: 03/21/19  8:02 PM   Specimen: Nasal Mucosa; Nasopharyngeal  Result Value Ref Range  Status   MRSA by PCR NEGATIVE NEGATIVE Final    Comment:        The GeneXpert MRSA Assay (FDA approved for NASAL specimens only), is one component of a comprehensive MRSA colonization surveillance program. It is  not intended to diagnose MRSA infection nor to guide or monitor treatment for MRSA infections. Performed at [facility], [address] [place], [place] [address]      Time coordinating discharge: 35 minutes  SIGNED:   Erick BlinksPratik D Sun Wilensky, DO [facility] 03/22/2019, 1:12 PM  If 7PM-7AM, please contact night-coverage [email or web address] Password [email or web address]

## 2019-03-22 NOTE — Progress Notes (Signed)
*  PRELIMINARY RESULTS* Echocardiogram 2D Echocardiogram has been performed.  Leavy Cella 03/22/2019, 9:42 AM

## 2019-03-24 LAB — HIV ANTIBODY (ROUTINE TESTING W REFLEX): HIV Screen 4th Generation wRfx: NONREACTIVE

## 2019-04-07 ENCOUNTER — Ambulatory Visit: Payer: Self-pay | Admitting: Cardiology

## 2019-06-06 ENCOUNTER — Other Ambulatory Visit: Payer: Self-pay

## 2019-06-06 ENCOUNTER — Encounter (HOSPITAL_COMMUNITY): Payer: Self-pay

## 2019-06-06 ENCOUNTER — Emergency Department (HOSPITAL_COMMUNITY)
Admission: EM | Admit: 2019-06-06 | Discharge: 2019-06-06 | Disposition: A | Discharge status: Home / Self Care | Payer: Self-pay | Attending: Emergency Medicine | Admitting: Emergency Medicine

## 2019-06-06 DIAGNOSIS — L0231 Cutaneous abscess of buttock: Secondary | ICD-10-CM | POA: Insufficient documentation

## 2019-06-06 DIAGNOSIS — Z5321 Procedure and treatment not carried out due to patient leaving prior to being seen by health care provider: Secondary | ICD-10-CM | POA: Insufficient documentation

## 2019-06-06 NOTE — ED Notes (Signed)
Pt states while he was getting up from the stretcher, he felt a gush of fluid and said the abscess broke open and is draining now.  Pt states he does not want to be seen and signed out.

## 2019-06-06 NOTE — ED Triage Notes (Signed)
Pt states he has an abscess between his buttocks.

## 2019-12-16 IMAGING — CT CT PELVIS W/ CM
2 of 4 series · 16 of 46 positions shown, 18 images · IV contrast (Isovue)
Comparison: CT abdomen and pelvis September 17, 2017

CLINICAL DATA: Worsening left hip buttock abscess for 1 week.

EXAM:
CT PELVIS WITH CONTRAST
TECHNIQUE: Multidetector CT imaging of the pelvis was performed using the
standard protocol following the bolus administration of intravenous
contrast.
CONTRAST:  100mL WJLZPT-R77 IOPAMIDOL (WJLZPT-R77) INJECTION 61%

[Series 2: axial st · axial · 0.98mm/px · z∈[-375,-20]mm · 13 of 83 slices shown, 15 images]
[im 6/83  soft-tissue]
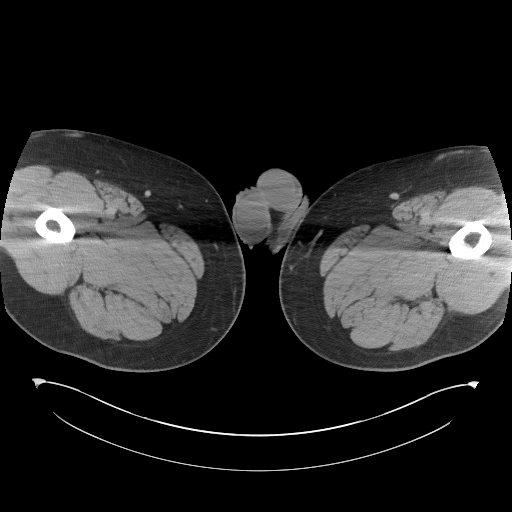
[im 6/83  bone]
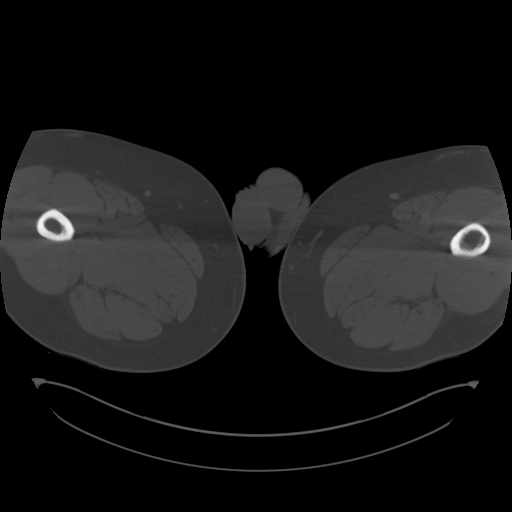
[im 11/83  soft-tissue]
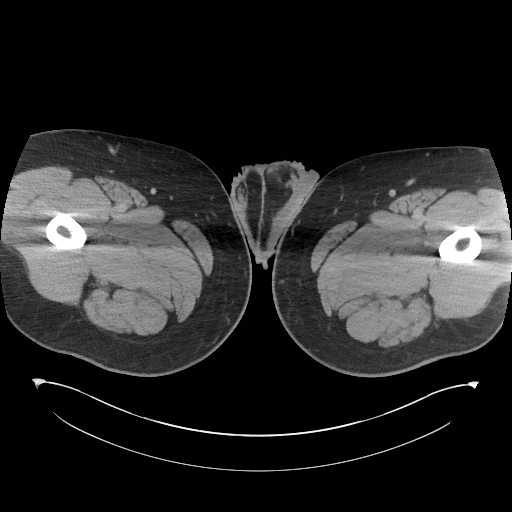
[im 17/83  soft-tissue]
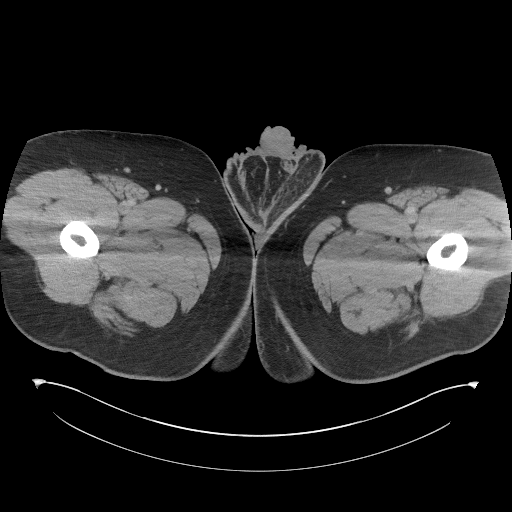
[im 22/83  soft-tissue]
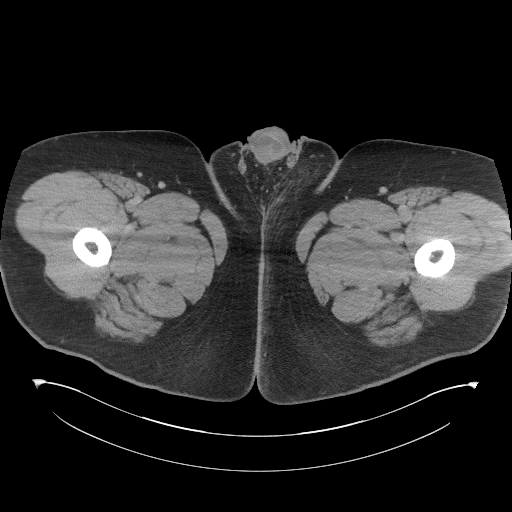
[im 28/83  soft-tissue]
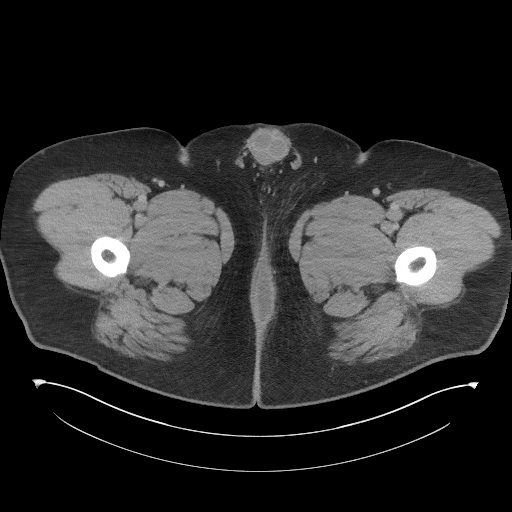
[im 33/83  soft-tissue]
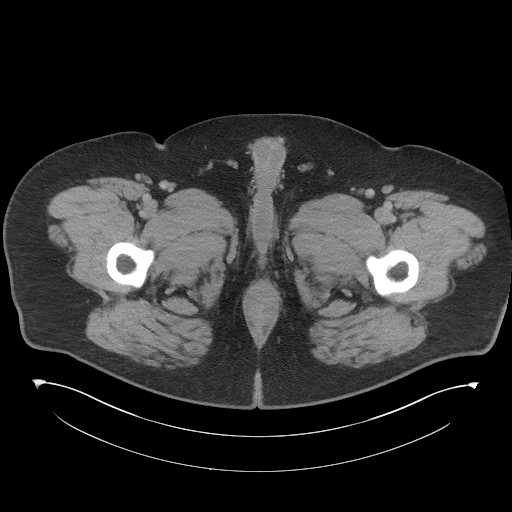
[im 44/83  soft-tissue]
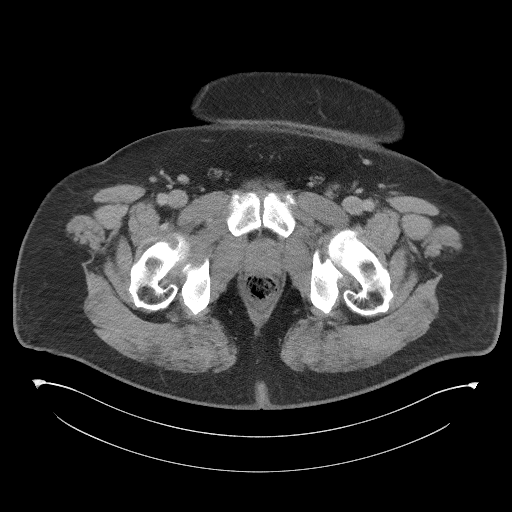
[im 50/83  soft-tissue]
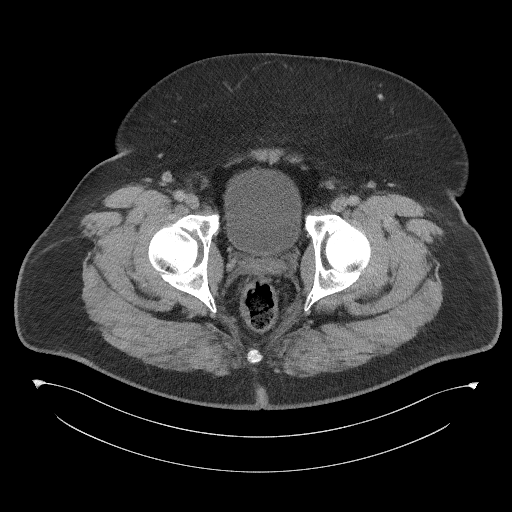
[im 55/83  soft-tissue]
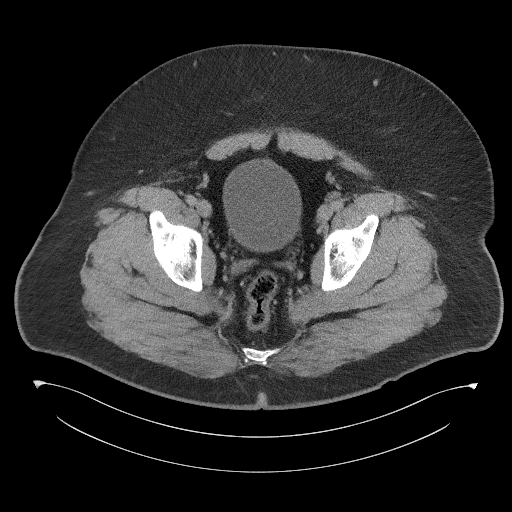
[im 55/83  bone]
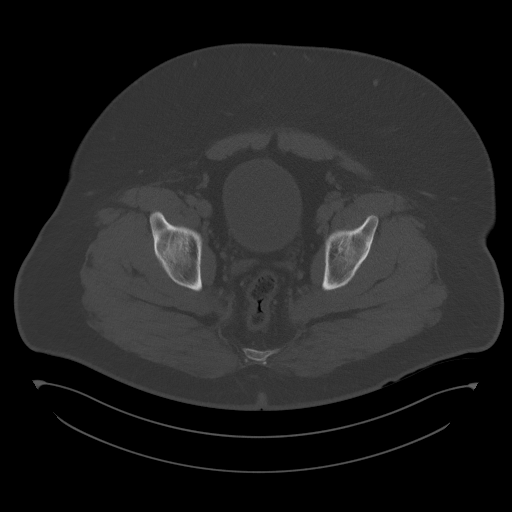
[im 61/83  soft-tissue]
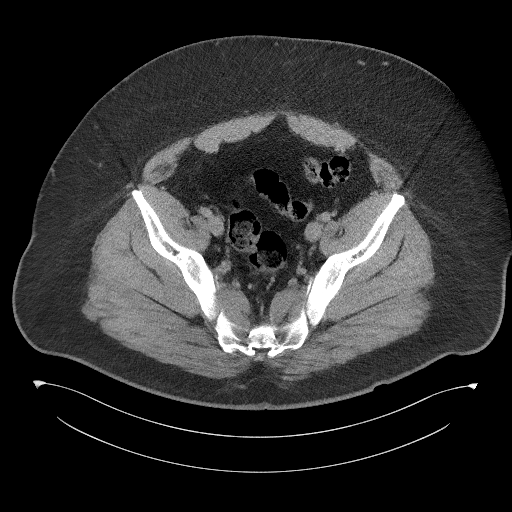
[im 66/83  soft-tissue]
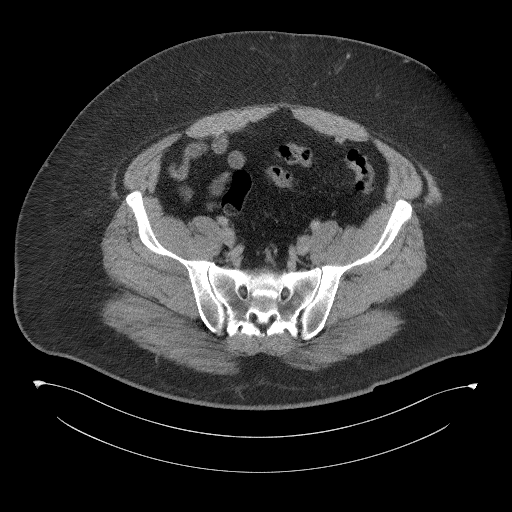
[im 72/83  soft-tissue]
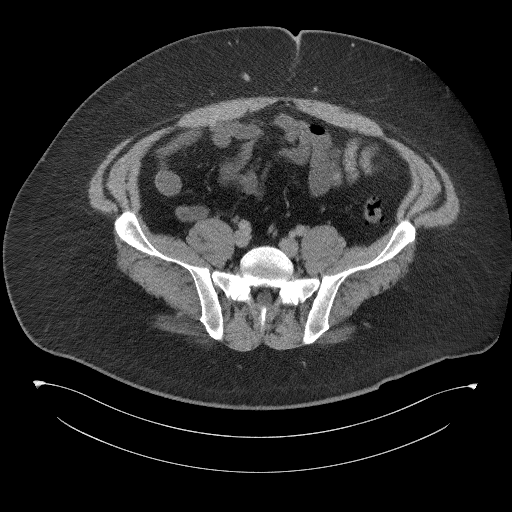
[im 77/83  soft-tissue]
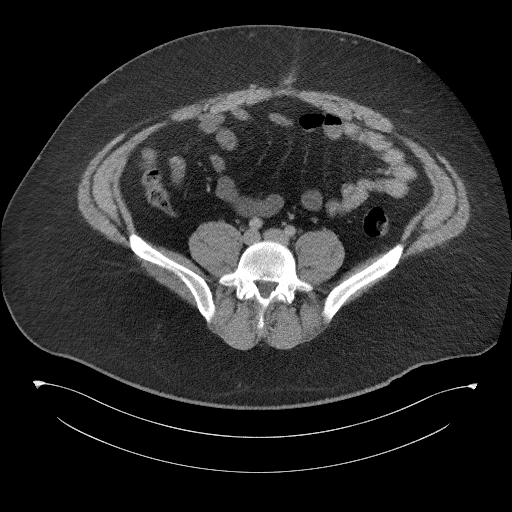

[Series 4: coronal st · coronal · 0.81mm/px · 3 of 131 slices shown]
[im 44/131  soft-tissue]
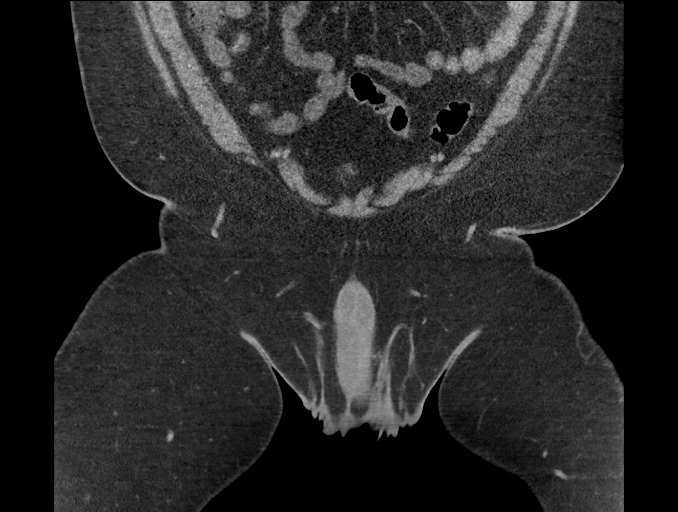
[im 58/131  soft-tissue]
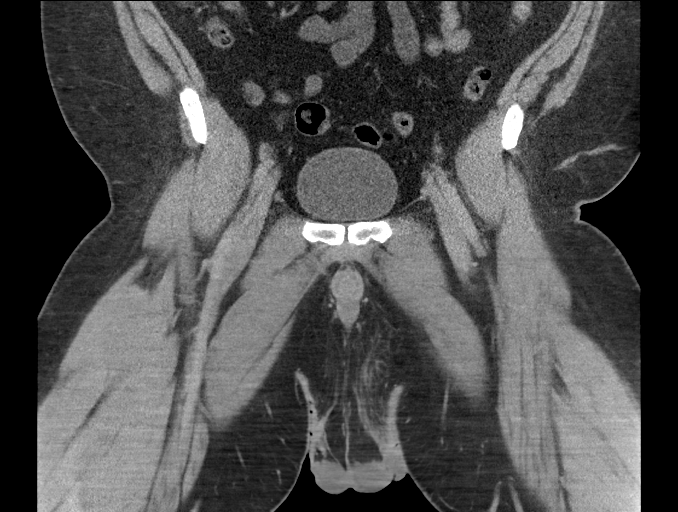
[im 73/131  soft-tissue]
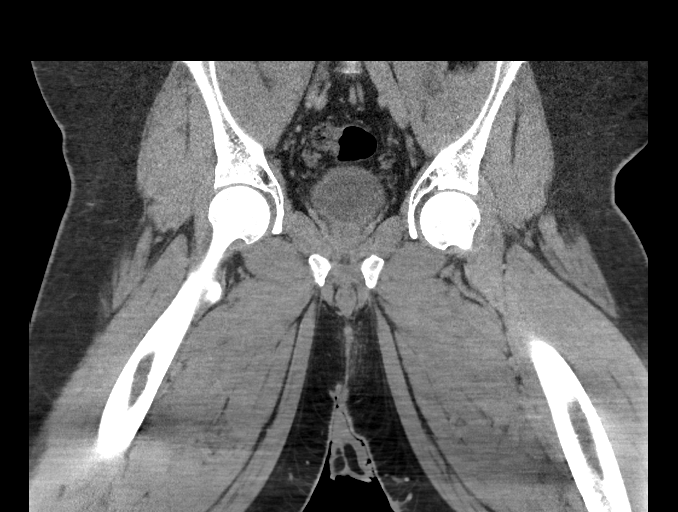

[16 of 46 positions shown; findings below may reference images not displayed]

FINDINGS: Urinary Tract:  Urinary bladder is well distended and unremarkable.

Bowel:  Normal course and caliber.  Normal appendix.

Vascular/Lymphatic: Normal course and caliber. Resolution (or out of
field of view) of prior epiploic appendagitis.

Reproductive:  Prostate and seminal vesicles are normal.

Other: 1.6 x 3.1 x 5.2 cm (transverse by cc by AP) rim enhancing
fluid collection left perianal soft tissues. No subcutaneous gas. No
radiopaque foreign bodies.

Musculoskeletal: Nonacute.
IMPRESSION: 1. 1.6 x 3.1 x 5.2 cm perianal abscess. No intraperitoneal extension
or necrotizing fasciitis.

## 2021-11-08 ENCOUNTER — Other Ambulatory Visit: Payer: Self-pay

## 2021-11-08 ENCOUNTER — Encounter (HOSPITAL_COMMUNITY): Payer: Self-pay | Admitting: *Deleted

## 2021-11-08 ENCOUNTER — Emergency Department (HOSPITAL_COMMUNITY)
Admission: EM | Admit: 2021-11-08 | Discharge: 2021-11-08 | Disposition: A | Discharge status: Home / Self Care | Payer: Self-pay | Attending: Emergency Medicine | Admitting: Emergency Medicine

## 2021-11-08 DIAGNOSIS — Z20822 Contact with and (suspected) exposure to covid-19: Secondary | ICD-10-CM | POA: Insufficient documentation

## 2021-11-08 DIAGNOSIS — I10 Essential (primary) hypertension: Secondary | ICD-10-CM | POA: Insufficient documentation

## 2021-11-08 DIAGNOSIS — R002 Palpitations: Secondary | ICD-10-CM

## 2021-11-08 HISTORY — DX: Personal history of other diseases of the circulatory system: Z86.79

## 2021-11-08 LAB — CBC WITH DIFFERENTIAL/PLATELET
Abs Immature Granulocytes: 0.03 10*3/uL (ref 0.00–0.07)
Basophils Absolute: 0.1 10*3/uL (ref 0.0–0.1)
Basophils Relative: 1 %
Eosinophils Absolute: 0.4 10*3/uL (ref 0.0–0.5)
Eosinophils Relative: 6 %
HCT: 48.5 % (ref 39.0–52.0)
Hemoglobin: 16.5 g/dL (ref 13.0–17.0)
Immature Granulocytes: 0 %
Lymphocytes Relative: 34 %
Lymphs Abs: 2.6 10*3/uL (ref 0.7–4.0)
MCH: 29 pg (ref 26.0–34.0)
MCHC: 34 g/dL (ref 30.0–36.0)
MCV: 85.2 fL (ref 80.0–100.0)
Monocytes Absolute: 0.7 10*3/uL (ref 0.1–1.0)
Monocytes Relative: 9 %
Neutro Abs: 3.7 10*3/uL (ref 1.7–7.7)
Neutrophils Relative %: 50 %
Platelets: 278 10*3/uL (ref 150–400)
RBC: 5.69 MIL/uL (ref 4.22–5.81)
RDW: 13.1 % (ref 11.5–15.5)
WBC: 7.5 10*3/uL (ref 4.0–10.5)
nRBC: 0 % (ref 0.0–0.2)

## 2021-11-08 LAB — BASIC METABOLIC PANEL
Anion gap: 7 (ref 5–15)
BUN: 11 mg/dL (ref 6–20)
CO2: 26 mmol/L (ref 22–32)
Calcium: 8.8 mg/dL — ABNORMAL LOW (ref 8.9–10.3)
Chloride: 104 mmol/L (ref 98–111)
Creatinine, Ser: 1.07 mg/dL (ref 0.61–1.24)
GFR, Estimated: >60 mL/min (ref >60)
Glucose, Bld: 110 mg/dL — ABNORMAL HIGH (ref 70–99)
Potassium: 4 mmol/L (ref 3.5–5.1)
Sodium: 137 mmol/L (ref 135–145)

## 2021-11-08 LAB — RESP PANEL BY RT-PCR (FLU A&B, COVID) ARPGX2
Influenza A by PCR: NEGATIVE
Influenza B by PCR: NEGATIVE
SARS Coronavirus 2 by RT PCR: NEGATIVE

## 2021-11-08 LAB — TSH: TSH: 2.229 u[IU]/mL (ref 0.350–4.500)

## 2021-11-08 LAB — RAPID URINE DRUG SCREEN, HOSP PERFORMED
Amphetamines: NOT DETECTED
Barbiturates: NOT DETECTED
Benzodiazepines: NOT DETECTED
Cocaine: NOT DETECTED
Opiates: NOT DETECTED
Tetrahydrocannabinol: POSITIVE — AB

## 2021-11-08 LAB — TROPONIN I (HIGH SENSITIVITY)
Troponin I (High Sensitivity): 3 ng/L (ref <18)
Troponin I (High Sensitivity): 3 ng/L (ref <18)

## 2021-11-08 MED ORDER — DILTIAZEM HCL ER COATED BEADS 120 MG PO CP24: 120.0000 mg | ORAL_CAPSULE | Freq: Every day | ORAL | 3 refills | Status: DC

## 2021-11-08 NOTE — ED Provider Notes (Signed)
[facility] EMERGENCY DEPARTMENT Provider Note   CSN: [id number] Arrival date & time: 11/08/21  5701     History  Chief Complaint  Patient presents with   Palpitations    Karl Nicholson is a 36 y.o. male.  Patient presents with palpitations, lightheadedness, feeling sweaty since waking up this morning.  Symptoms have gradually resolved.  Patient took 2 regular aspirin and sat down.  Patient had this happen once in the past.  Patient was told he has irregular heart rate in the past but did not follow-up or start medication due to financial reasons.  Patient had minimal chest discomfort this morning none currently.  Recent exertional shortness of breath no chest pain or pressure with exertion.  No syncope.  Patient denies illegal drugs.      Home Medications Prior to Admission medications   Medication Sig Start Date End Date Taking? Authorizing Provider  diltiazem (CARDIZEM CD) 120 MG 24 hr capsule Take 1 capsule (120 mg total) by mouth daily. 11/08/21 11/08/22  Blane Ohara, MD      Allergies    Penicillins    Review of Systems   Review of Systems  Physical Exam Updated Vital Signs BP (!) 134/95    Pulse 77    Temp 98.2 F (36.8 C) (Oral)    Resp 20    Ht 5\' 11"  (1.803 m)    Wt (!) 158.8 kg    SpO2 98%    BMI 48.82 kg/m  Physical Exam Vitals and nursing note reviewed.  Constitutional:      General: He is not in acute distress.    Appearance: He is well-developed.  HENT:     Head: Normocephalic and atraumatic.     Mouth/Throat:     Mouth: Mucous membranes are moist.  Eyes:     General:        Right eye: No discharge.        Left eye: No discharge.     Conjunctiva/sclera: Conjunctivae normal.  Neck:     Trachea: No tracheal deviation.  Cardiovascular:     Rate and Rhythm: Normal rate and regular rhythm.     Heart sounds: No murmur heard. Pulmonary:     Effort: Pulmonary effort is normal.     Breath sounds: Normal breath sounds.  Abdominal:     General:  There is no distension.     Palpations: Abdomen is soft.     Tenderness: There is no abdominal tenderness. There is no guarding.  Musculoskeletal:     Cervical back: Normal range of motion and neck supple. No rigidity.  Skin:    General: Skin is warm.     Capillary Refill: Capillary refill takes less than 2 seconds.     Findings: No rash.  Neurological:     General: No focal deficit present.     Mental Status: He is alert.     Cranial Nerves: No cranial nerve deficit.  Psychiatric:        Mood and Affect: Mood normal.    ED Results / Procedures / Treatments   Labs (all labs ordered are listed, but only abnormal results are displayed) Labs Reviewed  BASIC METABOLIC PANEL - Abnormal; Notable for the following components:      Result Value   Glucose, Bld 110 (*)    Calcium 8.8 (*)    All other components within normal limits  RAPID URINE DRUG SCREEN, HOSP PERFORMED - Abnormal; Notable for the following components:   Tetrahydrocannabinol  POSITIVE (*)    All other components within normal limits  RESP PANEL BY RT-PCR (FLU A&B, COVID) ARPGX2  CBC WITH DIFFERENTIAL/PLATELET  TSH  TROPONIN I (HIGH SENSITIVITY)  TROPONIN I (HIGH SENSITIVITY)    EKG EKG Interpretation  Date/Time:  Wednesday November 08 2021 07:40:41 EST Ventricular Rate:  81 PR Interval:  167 QRS Duration: 69 QT Interval:  346 QTC Calculation: 402 R Axis:   32 Text Interpretation: Sinus rhythm Low voltage, precordial leads Abnormal R-wave progression, early transition Confirmed by Blane Ohara [phone]) on 11/08/2021 7:48:03 AM  Radiology No results found.  Procedures Procedures    Medications Ordered in ED Medications - No data to display  ED Course/ Medical Decision Making/ A&P                           Medical Decision Making Amount and/or Complexity of Data Reviewed Labs: ordered.  Risk Prescription drug management.   Patient presents with palpitations and lightheadedness since this  morning.  Reviewed medical records and patient had admission for atrial fibrillation and was recommended follow-up with cardiology and start medications.  Patient was noncompliant due to financial situation.  We had a long discussion regarding his health, risk of stroke/heart attack/poor outcome if he continues to his lifestyle and noncompliance.  EKG reviewed no acute ischemic findings.  Patient has no significant symptoms on reassessment.  Patient in sinus rhythm.  Vital signs unremarkable except for mild elevated blood pressure.  Differential most concerning for atrial fibrillation with history of similar, other arrhythmia, metabolic, ACS, other.  Patient has no risk factors for pulm embolism.  Blood work ordered and reviewed 2 troponins negative, normal hemoglobin, normal electrolytes.  Patient stable for outpatient follow-up with cardiology.  Previous prescription reordered.         Final Clinical Impression(s) / ED Diagnoses Final diagnoses:  Palpitations  Primary hypertension    Rx / DC Orders ED Discharge Orders          Ordered    diltiazem (CARDIZEM CD) 120 MG 24 hr capsule  Daily        11/08/21 0946              Blane Ohara, MD 11/08/21 1044

## 2021-11-08 NOTE — Discharge Instructions (Signed)
Please follow-up closely with cardiology.  Start taking your blood pressure medicine as previously prescribed.  Return for shortness of breath, persistent chest pain or pressure or new concerns.  Gradually increase exertional activities as tolerated to help with weight loss.

## 2021-11-08 NOTE — ED Triage Notes (Signed)
Pt woke up feeling sweaty, dizzy and his heart racing. Pt took 2 regular ASA and sat down. Pt reports it eased some, but then came back.

## 2023-07-15 ENCOUNTER — Emergency Department (HOSPITAL_COMMUNITY): Payer: BC Managed Care – PPO

## 2023-07-15 ENCOUNTER — Encounter (HOSPITAL_COMMUNITY): Payer: Self-pay | Admitting: Emergency Medicine

## 2023-07-15 ENCOUNTER — Other Ambulatory Visit: Payer: Self-pay

## 2023-07-15 ENCOUNTER — Emergency Department (HOSPITAL_COMMUNITY)
Admission: EM | Admit: 2023-07-15 | Discharge: 2023-07-15 | Disposition: A | Discharge status: Home / Self Care | Payer: BC Managed Care – PPO | Attending: Emergency Medicine | Admitting: Emergency Medicine

## 2023-07-15 DIAGNOSIS — M48061 Spinal stenosis, lumbar region without neurogenic claudication: Secondary | ICD-10-CM | POA: Diagnosis not present

## 2023-07-15 DIAGNOSIS — Y93K1 Activity, walking an animal: Secondary | ICD-10-CM | POA: Diagnosis not present

## 2023-07-15 DIAGNOSIS — M549 Dorsalgia, unspecified: Secondary | ICD-10-CM | POA: Diagnosis not present

## 2023-07-15 DIAGNOSIS — M546 Pain in thoracic spine: Secondary | ICD-10-CM | POA: Insufficient documentation

## 2023-07-15 DIAGNOSIS — M4804 Spinal stenosis, thoracic region: Secondary | ICD-10-CM | POA: Diagnosis not present

## 2023-07-15 DIAGNOSIS — W109XXA Fall (on) (from) unspecified stairs and steps, initial encounter: Secondary | ICD-10-CM | POA: Diagnosis not present

## 2023-07-15 DIAGNOSIS — M4184 Other forms of scoliosis, thoracic region: Secondary | ICD-10-CM | POA: Diagnosis not present

## 2023-07-15 DIAGNOSIS — M545 Low back pain, unspecified: Secondary | ICD-10-CM | POA: Diagnosis not present

## 2023-07-15 DIAGNOSIS — R939 Diagnostic imaging inconclusive due to excess body fat of patient: Secondary | ICD-10-CM | POA: Diagnosis not present

## 2023-07-15 DIAGNOSIS — S29002A Unspecified injury of muscle and tendon of back wall of thorax, initial encounter: Secondary | ICD-10-CM | POA: Diagnosis not present

## 2023-07-15 DIAGNOSIS — S300XXA Contusion of lower back and pelvis, initial encounter: Secondary | ICD-10-CM | POA: Diagnosis not present

## 2023-07-15 DIAGNOSIS — M47816 Spondylosis without myelopathy or radiculopathy, lumbar region: Secondary | ICD-10-CM | POA: Diagnosis not present

## 2023-07-15 DIAGNOSIS — M5136 Other intervertebral disc degeneration, lumbar region with discogenic back pain only: Secondary | ICD-10-CM | POA: Diagnosis not present

## 2023-07-15 MED ORDER — NAPROXEN 500 MG PO TABS: 500.0000 mg | ORAL_TABLET | Freq: Two times a day (BID) | ORAL | 0 refills | Status: DC

## 2023-07-15 NOTE — ED Notes (Signed)
Patient transported to CT 

## 2023-07-15 NOTE — ED Triage Notes (Addendum)
Pt with c/o lower back pain after a fall last week. States pain is worse when lying down and that he has been unable to sleep. Pt ambulatory in triage.

## 2023-07-15 NOTE — ED Provider Notes (Signed)
EMERGENCY DEPARTMENT AT [facility] Provider Note   CSN: [id number] Arrival date & time: 07/15/23  0407     History  Chief Complaint  Patient presents with   Back Pain    Karl Nicholson is a 37 y.o. male.  Patient is a 37 year old male with history of paroxysmal atrial fibrillation.  Patient presenting today for evaluation of a back injury.  He reports walking his dog 1 week ago when the dog chased a cat and caused him to fall down several steps.  He has been having discomfort in his mid back since.  He denies radiation into his legs or bowel or bladder complaints.  Pain is worse with ambulation and movement and relieved somewhat with rest.  The history is provided by the patient.       Home Medications Prior to Admission medications   Medication Sig Start Date End Date Taking? Authorizing Provider  diltiazem (CARDIZEM CD) 120 MG 24 hr capsule Take 1 capsule (120 mg total) by mouth daily. 11/08/21 11/08/22  Blane Ohara, MD      Allergies    Penicillins    Review of Systems   Review of Systems  All other systems reviewed and are negative.   Physical Exam Updated Vital Signs BP (!) 150/98   Pulse 89   Temp 97.8 F (36.6 C) (Oral)   Resp 18   Ht 5\' 11"  (1.803 m)   Wt (!) 170.1 kg   SpO2 96%   BMI 52.30 kg/m  Physical Exam Vitals and nursing note reviewed.  Constitutional:      General: He is not in acute distress.    Appearance: He is well-developed. He is not diaphoretic.  HENT:     Head: Normocephalic and atraumatic.  Cardiovascular:     Rate and Rhythm: Normal rate and regular rhythm.     Heart sounds: No murmur heard.    No friction rub.  Pulmonary:     Effort: Pulmonary effort is normal. No respiratory distress.     Breath sounds: Normal breath sounds. No wheezing or rales.  Abdominal:     General: Bowel sounds are normal. There is no distension.     Palpations: Abdomen is soft.     Tenderness: There is no abdominal  tenderness.  Musculoskeletal:        General: Normal range of motion.     Cervical back: Normal range of motion and neck supple.     Comments: There is tenderness to palpation in the soft tissues of the lower thoracic/upper lumbar region.  There is no step-off.  Skin:    General: Skin is warm and dry.  Neurological:     General: No focal deficit present.     Mental Status: He is alert and oriented to person, place, and time.     Coordination: Coordination normal.     Comments: Strength is 5 out of 5 in both lower extremities.  Sensation is intact throughout.     ED Results / Procedures / Treatments   Labs (all labs ordered are listed, but only abnormal results are displayed) Labs Reviewed - No data to display  EKG None  Radiology No results found.  Procedures Procedures    Medications Ordered in ED Medications - No data to display  ED Course/ Medical Decision Making/ A&P  Patient presenting here with complaints of back pain that has been ongoing since he fell 1 week ago.  He arrives here neurologically intact, but complaining  of discomfort.  Vital signs are stable and patient is afebrile.  There is no neurologic deficits to his lower extremities.  There is tenderness in the mid back in the area of the lower T/upper L-spine.  CT scan of the thoracic and lumbar spines negative for acute injury, but does show some degenerative changes.  Patient declining any pain medication, but will be discharged with naproxen and follow-up as needed.  Final Clinical Impression(s) / ED Diagnoses Final diagnoses:  None    Rx / DC Orders ED Discharge Orders     None         Geoffery Lyons, MD 07/15/23 [phone]

## 2023-07-15 NOTE — Discharge Instructions (Signed)
Begin taking naproxen as prescribed.  Rest.  Follow-up with primary doctor if not improving in the next week.

## 2023-07-26 ENCOUNTER — Ambulatory Visit (INDEPENDENT_AMBULATORY_CARE_PROVIDER_SITE_OTHER): Payer: BC Managed Care – PPO | Admitting: Internal Medicine

## 2023-07-26 ENCOUNTER — Encounter: Payer: Self-pay | Admitting: Internal Medicine

## 2023-07-26 VITALS — BP 157/99 | HR 81 | Resp 16 | Ht 71.0 in | Wt 391.6 lb

## 2023-07-26 DIAGNOSIS — I10 Essential (primary) hypertension: Secondary | ICD-10-CM | POA: Insufficient documentation

## 2023-07-26 DIAGNOSIS — Z6841 Body Mass Index (BMI) 40.0 and over, adult: Secondary | ICD-10-CM

## 2023-07-26 DIAGNOSIS — Z13220 Encounter for screening for lipoid disorders: Secondary | ICD-10-CM

## 2023-07-26 DIAGNOSIS — R030 Elevated blood-pressure reading, without diagnosis of hypertension: Secondary | ICD-10-CM

## 2023-07-26 DIAGNOSIS — D539 Nutritional anemia, unspecified: Secondary | ICD-10-CM | POA: Diagnosis not present

## 2023-07-26 DIAGNOSIS — Z1159 Encounter for screening for other viral diseases: Secondary | ICD-10-CM

## 2023-07-26 DIAGNOSIS — Z1329 Encounter for screening for other suspected endocrine disorder: Secondary | ICD-10-CM | POA: Diagnosis not present

## 2023-07-26 DIAGNOSIS — Z131 Encounter for screening for diabetes mellitus: Secondary | ICD-10-CM

## 2023-07-26 DIAGNOSIS — Z1321 Encounter for screening for nutritional disorder: Secondary | ICD-10-CM

## 2023-07-26 DIAGNOSIS — Z8679 Personal history of other diseases of the circulatory system: Secondary | ICD-10-CM

## 2023-07-26 DIAGNOSIS — R202 Paresthesia of skin: Secondary | ICD-10-CM | POA: Insufficient documentation

## 2023-07-26 DIAGNOSIS — R200 Anesthesia of skin: Secondary | ICD-10-CM

## 2023-07-26 NOTE — Assessment & Plan Note (Signed)
He endorses intermittent numbness and tingling of the right hand.  Symptoms occur while working and driving.  Positive Phalen's noted on exam today, concerning for potential carpal tunnel syndrome.  We discussed conservative treatment for now, and I recommended trying a wrist splint.  Follow-up in 4 weeks for reassessment.

## 2023-07-26 NOTE — Progress Notes (Signed)
New Patient Office Visit  Subjective    Patient ID: Karl Nicholson, male    DOB: [birth year]  Age: 37 y.o. MRN: [id number]  CC:  Chief Complaint  Patient presents with   Establish Care    Sometimes he gets a sharp pain in his left side when he's working but its an intense sharp stabbing pain that will last a few minutes     HPI Karl Nicholson presents to establish care.  He is a 37 year old male with a past medical history significant for morbid obesity and A-fib with RVR.  He has not had a PCP recently.  Karl Nicholson reports feeling fairly well today.  He endorses periodic left flank pain and occasional numbness in his hands.  His general concern is wanting to improve his health as his third child is due to be born in late December.  He works as a Nutritional therapist and denies a history of tobacco, alcohol, and illicit drug use.  His family medical history is significant for diabetes mellitus and CAD.  Acute concerns, chronic medical conditions, and outstanding preventative care items discussed today are individually addressed in A/P below.   Outpatient Encounter Medications as of 07/26/2023  Medication Sig   naproxen (NAPROSYN) 500 MG tablet Take 1 tablet (500 mg total) by mouth 2 (two) times daily.   [DISCONTINUED] aspirin EC 81 MG tablet Take 81 mg by mouth daily. Swallow whole.   [DISCONTINUED] diltiazem (CARDIZEM CD) 120 MG 24 hr capsule Take 1 capsule (120 mg total) by mouth daily.   No facility-administered encounter medications on file as of 07/26/2023.    Past Medical History:  Diagnosis Date   History of atrial fibrillation     History reviewed. No pertinent surgical history.  Family History  Problem Relation Age of Onset   Diabetes Mother    Asthma Sister    Heart disease Maternal Grandfather     Social History   Socioeconomic History   Marital status: Single    Spouse name: Not on file   Number of children: Not on file   Years of education: Not on file   Highest  education level: Not on file  Occupational History   Not on file  Tobacco Use   Smoking status: Former   Smokeless tobacco: Never  Vaping Use   Vaping status: Never Used  Substance and Sexual Activity   Alcohol use: Yes    Comment: Rarely-once monthly   Drug use: Never   Sexual activity: Not on file  Other Topics Concern   Not on file  Social History Narrative   Not on file   Social Determinants of Health   Financial Resource Strain: Not on file  Food Insecurity: Not on file  Transportation Needs: Not on file  Physical Activity: Not on file  Stress: Not on file  Social Connections: Not on file  Intimate Partner Violence: Not on file   Review of Systems  Constitutional:  Negative for chills and fever.  HENT:  Negative for sore throat.   Respiratory:  Negative for cough and shortness of breath.   Cardiovascular:  Negative for chest pain, palpitations and leg swelling.  Gastrointestinal:  Negative for abdominal pain, blood in stool, constipation, diarrhea, nausea and vomiting.  Genitourinary:  Negative for dysuria and hematuria.  Musculoskeletal:  Negative for myalgias.  Skin:  Negative for itching and rash.  Neurological:  Positive for tingling (Intermittent numbness/tingling in both hands (R>L)). Negative for dizziness and headaches.  Psychiatric/Behavioral:  Negative for depression and suicidal ideas.    Objective    BP (!) 157/99   Pulse 81   Resp 16   Ht 5\' 11"  (1.803 m)   Wt (!) 391 lb 9.6 oz (177.6 kg)   SpO2 95%   BMI 54.62 kg/m   Physical Exam Vitals reviewed.  Constitutional:      General: He is not in acute distress.    Appearance: Normal appearance. He is obese. He is not ill-appearing.  HENT:     Head: Normocephalic and atraumatic.     Right Ear: External ear normal.     Left Ear: External ear normal.     Nose: Nose normal. No congestion or rhinorrhea.     Mouth/Throat:     Mouth: Mucous membranes are moist.     Pharynx: Oropharynx is clear.   Eyes:     General: No scleral icterus.    Extraocular Movements: Extraocular movements intact.     Conjunctiva/sclera: Conjunctivae normal.     Pupils: Pupils are equal, round, and reactive to light.  Cardiovascular:     Rate and Rhythm: Normal rate and regular rhythm.     Pulses: Normal pulses.     Heart sounds: Normal heart sounds. No murmur heard. Pulmonary:     Effort: Pulmonary effort is normal.     Breath sounds: Normal breath sounds. No wheezing, rhonchi or rales.  Abdominal:     General: Abdomen is flat. Bowel sounds are normal. There is no distension.     Palpations: Abdomen is soft.     Tenderness: There is no abdominal tenderness.  Musculoskeletal:        General: No swelling or deformity. Normal range of motion.     Cervical back: Normal range of motion.     Comments: Positive Phalen's in right hand  Skin:    General: Skin is warm and dry.     Capillary Refill: Capillary refill takes less than 2 seconds.  Neurological:     General: No focal deficit present.     Mental Status: He is alert and oriented to person, place, and time.     Motor: No weakness.  Psychiatric:        Mood and Affect: Mood normal.        Behavior: Behavior normal.        Thought Content: Thought content normal.    Assessment & Plan:   Problem List Items Addressed This Visit       History of atrial fibrillation    History of A-fib with RVR requiring overnight hospital admission in July 2020.  He was discharged on diltiazem and ASA 81 mg daily (CHA2DS2-VASc 0).  Not taking diltiazem currently.  Regular rate and rhythm detected on exam today.      Morbid obesity with BMI of 50.0-59.9, adult (HCC) - Primary    His main concern today is wanting to improve his overall health.  We discussed how morbid obesity is negatively impacting multiple areas of his health.  Lifestyle modifications aimed at weight loss were reviewed.  Screening labs were ordered.  He will return in 4 weeks for review.       Elevated blood pressure reading    BP elevated in office today.  No prior history of hypertension.  He will follow-up in 4 weeks for BP check.  In the interim, I requested that he check his blood pressure at home and keep a log.      Numbness and tingling  of right hand    He endorses intermittent numbness and tingling of the right hand.  Symptoms occur while working and driving.  Positive Phalen's noted on exam today, concerning for potential carpal tunnel syndrome.  We discussed conservative treatment for now, and I recommended trying a wrist splint.  Follow-up in 4 weeks for reassessment.      Return in about 4 weeks (around 08/23/2023) for lab review, HTN.   Billie Lade, MD

## 2023-07-26 NOTE — Assessment & Plan Note (Signed)
BP elevated in office today.  No prior history of hypertension.  He will follow-up in 4 weeks for BP check.  In the interim, I requested that he check his blood pressure at home and keep a log.

## 2023-07-26 NOTE — Assessment & Plan Note (Signed)
His main concern today is wanting to improve his overall health.  We discussed how morbid obesity is negatively impacting multiple areas of his health.  Lifestyle modifications aimed at weight loss were reviewed.  Screening labs were ordered.  He will return in 4 weeks for review.

## 2023-07-26 NOTE — Patient Instructions (Signed)
It was a pleasure to see you today.  Thank you for giving [place] the opportunity to be involved in your care.  Below is a brief recap of your visit and next steps.  We will plan to see you again in 4 weeks.  Summary You have established care today We will check basic labs and ECG Try a cock-up wrist splint for hand numbness Follow up in 4 weeks for lab review and bp check

## 2023-07-26 NOTE — Assessment & Plan Note (Signed)
History of A-fib with RVR requiring overnight hospital admission in July 2020.  He was discharged on diltiazem and ASA 81 mg daily (CHA2DS2-VASc 0).  Not taking diltiazem currently.  Regular rate and rhythm detected on exam today.

## 2023-07-27 LAB — CBC WITH DIFFERENTIAL/PLATELET
Basophils Absolute: 0.1 10*3/uL (ref 0.0–0.2)
Basos: 1 %
EOS (ABSOLUTE): 0.4 10*3/uL (ref 0.0–0.4)
Eos: 5 %
Hematocrit: 48.6 % (ref 37.5–51.0)
Hemoglobin: 15.7 g/dL (ref 13.0–17.7)
Immature Grans (Abs): 0 10*3/uL (ref 0.0–0.1)
Immature Granulocytes: 0 %
Lymphocytes Absolute: 2 10*3/uL (ref 0.7–3.1)
Lymphs: 30 %
MCH: 28.1 pg (ref 26.6–33.0)
MCHC: 32.3 g/dL (ref 31.5–35.7)
MCV: 87 fL (ref 79–97)
Monocytes Absolute: 0.5 10*3/uL (ref 0.1–0.9)
Monocytes: 8 %
Neutrophils Absolute: 3.8 10*3/uL (ref 1.4–7.0)
Neutrophils: 56 %
Platelets: 299 10*3/uL (ref 150–450)
RBC: 5.59 x10E6/uL (ref 4.14–5.80)
RDW: 12.9 % (ref 11.6–15.4)
WBC: 6.8 10*3/uL (ref 3.4–10.8)

## 2023-07-27 LAB — HCV AB W REFLEX TO QUANT PCR: HCV Ab: NONREACTIVE

## 2023-07-27 LAB — HEMOGLOBIN A1C
Est. average glucose Bld gHb Est-mCnc: 120 mg/dL
Hgb A1c MFr Bld: 5.8 % — ABNORMAL HIGH (ref 4.8–5.6)

## 2023-07-27 LAB — CMP14+EGFR
ALT: 28 [IU]/L (ref 0–44)
AST: 20 [IU]/L (ref 0–40)
Albumin: 4.4 g/dL (ref 4.1–5.1)
Alkaline Phosphatase: 92 [IU]/L (ref 44–121)
BUN/Creatinine Ratio: 12 (ref 9–20)
BUN: 13 mg/dL (ref 6–20)
Bilirubin Total: 0.3 mg/dL (ref 0.0–1.2)
CO2: 24 mmol/L (ref 20–29)
Calcium: 9.8 mg/dL (ref 8.7–10.2)
Chloride: 100 mmol/L (ref 96–106)
Creatinine, Ser: 1.1 mg/dL (ref 0.76–1.27)
Globulin, Total: 2.7 g/dL (ref 1.5–4.5)
Glucose: 105 mg/dL — ABNORMAL HIGH (ref 70–99)
Potassium: 4.3 mmol/L (ref 3.5–5.2)
Sodium: 142 mmol/L (ref 134–144)
Total Protein: 7.1 g/dL (ref 6.0–8.5)
eGFR: 88 mL/min/{1.73_m2} (ref >59)

## 2023-07-27 LAB — LIPID PANEL
Chol/HDL Ratio: 4.3 ratio (ref 0.0–5.0)
Cholesterol, Total: 170 mg/dL (ref 100–199)
HDL: 40 mg/dL (ref >39)
LDL Chol Calc (NIH): 95 mg/dL (ref 0–99)
Triglycerides: 202 mg/dL — ABNORMAL HIGH (ref 0–149)
VLDL Cholesterol Cal: 35 mg/dL (ref 5–40)

## 2023-07-27 LAB — TSH+FREE T4
Free T4: 1.02 ng/dL (ref 0.82–1.77)
TSH: 2 u[IU]/mL (ref 0.450–4.500)

## 2023-07-27 LAB — B12 AND FOLATE PANEL
Folate: 5.1 ng/mL (ref >3.0)
Vitamin B-12: 332 pg/mL (ref 232–1245)

## 2023-07-27 LAB — VITAMIN D 25 HYDROXY (VIT D DEFICIENCY, FRACTURES): Vit D, 25-Hydroxy: 34.3 ng/mL (ref 30.0–100.0)

## 2023-07-27 LAB — HCV INTERPRETATION

## 2023-08-30 ENCOUNTER — Encounter: Payer: Self-pay | Admitting: Internal Medicine

## 2023-08-30 ENCOUNTER — Ambulatory Visit: Payer: BC Managed Care – PPO | Admitting: Internal Medicine

## 2023-08-30 VITALS — BP 159/90 | HR 79 | Ht 72.0 in | Wt 393.6 lb

## 2023-08-30 DIAGNOSIS — R7303 Prediabetes: Secondary | ICD-10-CM

## 2023-08-30 DIAGNOSIS — I10 Essential (primary) hypertension: Secondary | ICD-10-CM | POA: Diagnosis not present

## 2023-08-30 DIAGNOSIS — Z113 Encounter for screening for infections with a predominantly sexual mode of transmission: Secondary | ICD-10-CM | POA: Diagnosis not present

## 2023-08-30 DIAGNOSIS — Z6841 Body Mass Index (BMI) 40.0 and over, adult: Secondary | ICD-10-CM

## 2023-08-30 MED ORDER — OLMESARTAN MEDOXOMIL 20 MG PO TABS: 20.0000 mg | ORAL_TABLET | Freq: Every day | ORAL | 2 refills | Status: DC

## 2023-08-30 NOTE — Patient Instructions (Addendum)
It was a pleasure to see you today.  Thank you for giving [place] the opportunity to be involved in your care.  Below is a brief recap of your visit and next steps.  We will plan to see you again in 3 months.  Summary Start olmesartan 20 mg daily for hypertension Follow up in 3 months Labs later this month

## 2023-08-30 NOTE — Progress Notes (Unsigned)
Established Patient Office Visit  Subjective   Patient ID: Karl Nicholson, male    DOB: 04-20-1986  Age: 37 y.o. MRN: [id number]  Chief Complaint  Patient presents with   lab review    Follow up    Blood Pressure Check   Mr. Karl Nicholson return to care today for BP check and lab review.  He was last evaluated by me on 11/8 as a new patient presenting to establish care.  His blood pressure was elevated at that time.  No prior history of hypertension.  4-week follow-up was arranged for BP check and review of basic labs.  He also endorsed numbness/tingling in the right hand.  Positive Phalen's test noted on exam.  I recommended using a wrist splint.  There have been no acute interval events. Mr. Karl Nicholson reports feeling well today.  He is asymptomatic currently and his acute concern today is requesting to undergo HSV screening.  Past Medical History:  Diagnosis Date   History of atrial fibrillation    History reviewed. No pertinent surgical history. 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 unknown Did it involve sudden or severe rash/hives, skin peeling, or any reaction on the inside of your mouth or nose? unknown Did you need to seek medical attention at a hospital or doctor's office? unknown When did it last happen?   childhood    If all above answers are "NO", may proceed with cephalosporin use.    Review of Systems  Constitutional:  Negative for chills and fever.  HENT:  Negative for sore throat.   Respiratory:  Positive for cough. Negative for  shortness of breath.   Cardiovascular:  Negative for chest pain, palpitations and leg swelling.  Gastrointestinal:  Negative for abdominal pain, blood in stool, constipation, diarrhea, nausea and vomiting.  Genitourinary:  Negative for dysuria and hematuria.  Musculoskeletal:  Negative for myalgias.  Skin:  Negative for itching and rash.  Neurological:  Negative for dizziness and headaches.  Psychiatric/Behavioral:  Negative for depression and suicidal ideas.      Objective:     BP (!) 159/90 (BP Location: Right Arm, Patient Position: Sitting, Cuff Size: Large)   Pulse 79   Ht 6' (1.829 m)   Wt (!) 393 lb 9.6 oz (178.5 kg)   SpO2 95%   BMI 53.38 kg/m  BP Readings from Last 3 Encounters:  08/30/23 (!) 159/90  07/26/23 (!) 157/99  07/15/23 (!) 146/93   Physical Exam Vitals reviewed.  Constitutional:      General: He is not in acute distress.    Appearance: Normal appearance. He is obese. He is not ill-appearing.  HENT:     Head: Normocephalic and atraumatic.     Right Ear: External ear normal.     Left Ear: External ear normal.     Nose: Nose normal. No congestion or rhinorrhea.     Mouth/Throat:     Mouth: Mucous membranes are moist.     Pharynx:  Oropharynx is clear.  Eyes:     General: No scleral icterus.    Extraocular Movements: Extraocular movements intact.     Conjunctiva/sclera: Conjunctivae normal.     Pupils: Pupils are equal, round, and reactive to light.  Cardiovascular:     Rate and Rhythm: Normal rate and regular rhythm.     Pulses: Normal pulses.     Heart sounds: Normal heart sounds. No murmur heard. Pulmonary:     Effort: Pulmonary effort is normal.     Breath sounds: Normal breath sounds. No wheezing, rhonchi or rales.  Abdominal:     General: Abdomen is flat. Bowel sounds are normal. There is no distension.     Palpations: Abdomen is soft.     Tenderness: There is no abdominal tenderness.  Musculoskeletal:        General: No swelling or  deformity. Normal range of motion.     Cervical back: Normal range of motion.  Skin:    General: Skin is warm and dry.     Capillary Refill: Capillary refill takes less than 2 seconds.  Neurological:     General: No focal deficit present.     Mental Status: He is alert and oriented to person, place, and time.     Motor: No weakness.  Psychiatric:        Mood and Affect: Mood normal.        Behavior: Behavior normal.        Thought Content: Thought content normal.   Last CBC Lab Results  Component Value Date   WBC 6.8 07/26/2023   HGB 15.7 07/26/2023   HCT 48.6 07/26/2023   MCV 87 07/26/2023   MCH 28.1 07/26/2023   RDW 12.9 07/26/2023   PLT 299 07/26/2023   Last metabolic panel Lab Results  Component Value Date   GLUCOSE 105 (H) 07/26/2023   NA 142 07/26/2023   K 4.3 07/26/2023   CL 100 07/26/2023   CO2 24 07/26/2023   BUN 13 07/26/2023   CREATININE 1.10 07/26/2023   EGFR 88 07/26/2023   CALCIUM 9.8 07/26/2023   PROT 7.1 07/26/2023   ALBUMIN 4.4 07/26/2023   LABGLOB 2.7 07/26/2023   BILITOT 0.3 07/26/2023   ALKPHOS 92 07/26/2023   AST 20 07/26/2023   ALT 28 07/26/2023   ANIONGAP 7 11/08/2021   Last lipids Lab Results  Component Value Date   CHOL 170 07/26/2023   HDL 40 07/26/2023   LDLCALC 95 07/26/2023   TRIG 202 (H) 07/26/2023   CHOLHDL 4.3 07/26/2023   Last hemoglobin A1c Lab Results  Component Value Date   HGBA1C 5.8 (H) 07/26/2023   Last thyroid functions Lab Results  Component Value Date   TSH 2.000 07/26/2023   Last vitamin D Lab Results  Component Value Date   VD25OH 34.3 07/26/2023   Last vitamin B12 and Folate Lab Results  Component Value Date   VITAMINB12 332 07/26/2023   FOLATE 5.1 07/26/2023     Assessment & Plan:   Problem List Items Addressed This Visit       Essential hypertension - Primary   New diagnosis.  Meeting criteria as his blood pressure remains elevated again today.  Start olmesartan 20 mg daily. -Nurse visit  for BP check and BMP in 1 month      Morbid obesity with BMI of 50.0-59.9, adult (HCC)   His weight today is 393 pounds.  BMI 53.  He recognizes that morbid obesity is negatively impacting multiple areas of  his health.  For now, he will focus on their changes in an effort to lose weight and improve his blood sugar.  We will follow-up in 3 months.  Consider starting GLP-1 therapy at that time.      Prediabetes   A1c 5.8 on labs from last month.  We reviewed the importance of making significant dietary changes in an effort to lose weight and improve his blood sugar.  Consider GLP-1 therapy at follow-up in 3 months.      Return in about 3 months (around 11/28/2023).   Billie Lade, MD

## 2023-08-31 ENCOUNTER — Encounter: Payer: Self-pay | Admitting: Internal Medicine

## 2023-08-31 DIAGNOSIS — R7303 Prediabetes: Secondary | ICD-10-CM | POA: Insufficient documentation

## 2023-08-31 NOTE — Assessment & Plan Note (Signed)
New diagnosis.  Meeting criteria as his blood pressure remains elevated again today.  Start olmesartan 20 mg daily. -Nurse visit for BP check and BMP in 1 month

## 2023-08-31 NOTE — Assessment & Plan Note (Signed)
A1c 5.8 on labs from last month.  We reviewed the importance of making significant dietary changes in an effort to lose weight and improve his blood sugar.  Consider GLP-1 therapy at follow-up in 3 months.

## 2023-08-31 NOTE — Assessment & Plan Note (Signed)
His weight today is 393 pounds.  BMI 53.  He recognizes that morbid obesity is negatively impacting multiple areas of his health.  For now, he will focus on their changes in an effort to lose weight and improve his blood sugar.  We will follow-up in 3 months.  Consider starting GLP-1 therapy at that time.

## 2023-09-13 DIAGNOSIS — Z113 Encounter for screening for infections with a predominantly sexual mode of transmission: Secondary | ICD-10-CM | POA: Diagnosis not present

## 2023-09-13 DIAGNOSIS — B009 Herpesviral infection, unspecified: Secondary | ICD-10-CM | POA: Diagnosis not present

## 2023-09-13 DIAGNOSIS — I10 Essential (primary) hypertension: Secondary | ICD-10-CM | POA: Diagnosis not present

## 2023-09-14 LAB — HSV 1 AND 2 AB, IGG
HSV 1 Glycoprotein G Ab, IgG: REACTIVE — AB
HSV 2 IgG, Type Spec: NONREACTIVE

## 2023-09-14 LAB — BASIC METABOLIC PANEL
BUN/Creatinine Ratio: 10 (ref 9–20)
BUN: 11 mg/dL (ref 6–20)
CO2: 23 mmol/L (ref 20–29)
Calcium: 9.5 mg/dL (ref 8.7–10.2)
Chloride: 101 mmol/L (ref 96–106)
Creatinine, Ser: 1.11 mg/dL (ref 0.76–1.27)
Glucose: 95 mg/dL (ref 70–99)
Potassium: 4.2 mmol/L (ref 3.5–5.2)
Sodium: 140 mmol/L (ref 134–144)
eGFR: 87 mL/min/{1.73_m2} (ref >59)

## 2023-10-03 ENCOUNTER — Emergency Department (HOSPITAL_COMMUNITY): Payer: BC Managed Care – PPO

## 2023-10-03 ENCOUNTER — Other Ambulatory Visit: Payer: Self-pay

## 2023-10-03 ENCOUNTER — Emergency Department (HOSPITAL_COMMUNITY)
Admission: EM | Admit: 2023-10-03 | Discharge: 2023-10-03 | Disposition: A | Discharge status: Home / Self Care | Payer: BC Managed Care – PPO | Attending: Emergency Medicine | Admitting: Emergency Medicine

## 2023-10-03 ENCOUNTER — Encounter (HOSPITAL_COMMUNITY): Payer: Self-pay

## 2023-10-03 DIAGNOSIS — R079 Chest pain, unspecified: Secondary | ICD-10-CM | POA: Diagnosis not present

## 2023-10-03 DIAGNOSIS — I4891 Unspecified atrial fibrillation: Secondary | ICD-10-CM | POA: Diagnosis not present

## 2023-10-03 DIAGNOSIS — I10 Essential (primary) hypertension: Secondary | ICD-10-CM | POA: Diagnosis not present

## 2023-10-03 DIAGNOSIS — Z79899 Other long term (current) drug therapy: Secondary | ICD-10-CM | POA: Diagnosis not present

## 2023-10-03 DIAGNOSIS — Z87891 Personal history of nicotine dependence: Secondary | ICD-10-CM | POA: Insufficient documentation

## 2023-10-03 DIAGNOSIS — R0789 Other chest pain: Secondary | ICD-10-CM | POA: Diagnosis not present

## 2023-10-03 DIAGNOSIS — R0602 Shortness of breath: Secondary | ICD-10-CM | POA: Diagnosis not present

## 2023-10-03 LAB — COMPREHENSIVE METABOLIC PANEL
ALT: 34 U/L (ref 0–44)
AST: 26 U/L (ref 15–41)
Albumin: 4.1 g/dL (ref 3.5–5.0)
Alkaline Phosphatase: 64 U/L (ref 38–126)
Anion gap: 9 (ref 5–15)
BUN: 12 mg/dL (ref 6–20)
CO2: 27 mmol/L (ref 22–32)
Calcium: 9 mg/dL (ref 8.9–10.3)
Chloride: 100 mmol/L (ref 98–111)
Creatinine, Ser: 1.11 mg/dL (ref 0.61–1.24)
GFR, Estimated: >60 mL/min (ref >60)
Glucose, Bld: 154 mg/dL — ABNORMAL HIGH (ref 70–99)
Potassium: 4.2 mmol/L (ref 3.5–5.1)
Sodium: 136 mmol/L (ref 135–145)
Total Bilirubin: 1 mg/dL (ref 0.0–1.2)
Total Protein: 7.8 g/dL (ref 6.5–8.1)

## 2023-10-03 LAB — CBC WITH DIFFERENTIAL/PLATELET
Abs Immature Granulocytes: 0.02 10*3/uL (ref 0.00–0.07)
Basophils Absolute: 0.1 10*3/uL (ref 0.0–0.1)
Basophils Relative: 1 %
Eosinophils Absolute: 0.4 10*3/uL (ref 0.0–0.5)
Eosinophils Relative: 6 %
HCT: 48.7 % (ref 39.0–52.0)
Hemoglobin: 16.3 g/dL (ref 13.0–17.0)
Immature Granulocytes: 0 %
Lymphocytes Relative: 33 %
Lymphs Abs: 2.1 10*3/uL (ref 0.7–4.0)
MCH: 28.3 pg (ref 26.0–34.0)
MCHC: 33.5 g/dL (ref 30.0–36.0)
MCV: 84.7 fL (ref 80.0–100.0)
Monocytes Absolute: 0.4 10*3/uL (ref 0.1–1.0)
Monocytes Relative: 7 %
Neutro Abs: 3.4 10*3/uL (ref 1.7–7.7)
Neutrophils Relative %: 53 %
Platelets: 228 10*3/uL (ref 150–400)
RBC: 5.75 MIL/uL (ref 4.22–5.81)
RDW: 12.9 % (ref 11.5–15.5)
WBC: 6.5 10*3/uL (ref 4.0–10.5)
nRBC: 0 % (ref 0.0–0.2)

## 2023-10-03 LAB — TROPONIN I (HIGH SENSITIVITY): Troponin I (High Sensitivity): 3 ng/L (ref <18)

## 2023-10-03 LAB — MAGNESIUM: Magnesium: 2.2 mg/dL (ref 1.7–2.4)

## 2023-10-03 MED ORDER — ETOMIDATE 2 MG/ML IV SOLN
10.0000 mg | Freq: Once | INTRAVENOUS | Status: AC
  Administered 2023-10-03: 10 mg via INTRAVENOUS
  Filled 2023-10-03: qty 10

## 2023-10-03 MED ORDER — FENTANYL CITRATE PF 50 MCG/ML IJ SOSY
50.0000 ug | PREFILLED_SYRINGE | Freq: Once | INTRAMUSCULAR | Status: AC
  Administered 2023-10-03: 50 ug via INTRAVENOUS
  Filled 2023-10-03: qty 1
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
## 2023-10-03 NOTE — ED Triage Notes (Signed)
Pt woke up this am with tightness in chest and sweating. Took BP meds along with ASA. States its better but not gone now. Hx of HTN, irregular heart beat. Takes ASA daily.

## 2023-10-03 NOTE — ED Provider Notes (Signed)
[place] EMERGENCY DEPARTMENT AT [facility] Provider Note  CSN: [id number] Arrival date & time: 10/03/23 5621  Chief Complaint(s) Shortness of Breath (Chest tightness)  HPI Karl Nicholson is a 38 y.o. male with history of obesity, hypertension presenting to the emergency department with chest tightness.  Patient reports he woke up this morning with racing heartbeat, chest tightness, mild shortness of breath.  Reports that this feels similar to previous episodes of A-fib.  No nausea, vomiting.  Reports some diaphoresis.  No lightheadedness or dizziness.  Reports he was feeling normal yesterday without sensation of palpitations or racing heartbeat.  Not on any blood thinners.  No recent travel or surgeries.  Pain described as a tightness.  No leg swelling.   Past Medical History Past Medical History:  Diagnosis Date   History of atrial fibrillation    Patient Active Problem List   Diagnosis Date Noted   Prediabetes 08/31/2023   Morbid obesity with BMI of 50.0-59.9, adult (HCC) 07/26/2023   Essential hypertension 07/26/2023   Numbness and tingling of right hand 07/26/2023   History of atrial fibrillation 03/21/2019   Home Medication(s) Prior to Admission medications   Medication Sig Start Date End Date Taking? Authorizing Provider  naproxen (NAPROSYN) 500 MG tablet Take 1 tablet (500 mg total) by mouth 2 (two) times daily. 07/15/23   Geoffery Lyons, MD  olmesartan (BENICAR) 20 MG tablet Take 1 tablet (20 mg total) by mouth daily. 08/30/23 11/28/23  Billie Lade, MD                                                                                                                                    Past Surgical History History reviewed. No pertinent surgical history. Family History Family History  Problem Relation Age of Onset   Diabetes Mother    Asthma Sister    Heart disease Maternal Grandfather     Social History Social History   Tobacco Use   Smoking status:  Former   Smokeless tobacco: Never  Advertising account planner   Vaping status: Never Used  Substance Use Topics   Alcohol use: Yes    Comment: Rarely-once monthly   Drug use: Never   Allergies Penicillins  Review of Systems Review of Systems  All other systems reviewed and are negative.   Physical Exam Vital Signs  I have reviewed the triage vital signs BP 117/84   Pulse 87   Temp 97.8 F (36.6 C) (Oral)   Resp 16   Ht 6' (1.829 m)   Wt (!) 176.9 kg   SpO2 95%   BMI 52.89 kg/m  Physical Exam Vitals and nursing note reviewed.  Constitutional:      General: He is not in acute distress.    Appearance: Normal appearance. He is obese.  HENT:     Mouth/Throat:     Mouth: Mucous membranes are moist.  Eyes:  Conjunctiva/sclera: Conjunctivae normal.  Cardiovascular:     Rate and Rhythm: Tachycardia present. Rhythm irregular.  Pulmonary:     Effort: Pulmonary effort is normal. No respiratory distress.     Breath sounds: Normal breath sounds.  Abdominal:     General: Abdomen is flat.     Palpations: Abdomen is soft.     Tenderness: There is no abdominal tenderness.  Musculoskeletal:     Right lower leg: No edema.     Left lower leg: No edema.  Skin:    General: Skin is warm and dry.     Capillary Refill: Capillary refill takes less than 2 seconds.  Neurological:     Mental Status: He is alert and oriented to person, place, and time. Mental status is at baseline.  Psychiatric:        Mood and Affect: Mood normal.        Behavior: Behavior normal.     ED Results and Treatments Labs (all labs ordered are listed, but only abnormal results are displayed) Labs Reviewed  COMPREHENSIVE METABOLIC PANEL - Abnormal; Notable for the following components:      Result Value   Glucose, Bld 154 (*)    All other components within normal limits  CBC WITH DIFFERENTIAL/PLATELET  MAGNESIUM  TROPONIN I (HIGH SENSITIVITY)                                                                                                                           Radiology DG Chest Port 1 View Result Date: 10/03/2023 CLINICAL DATA:  Chest pain. EXAM: PORTABLE CHEST 1 VIEW COMPARISON:  Chest radiograph dated March 21, 2019. FINDINGS: The heart size and mediastinal contours are within normal limits. No focal consolidation, sizeable pleural effusion, or pneumothorax. No acute osseous abnormality. IMPRESSION: No acute cardiopulmonary findings. Electronically Signed   By: Hart Robinsons M.D.   On: 10/03/2023 08:38    Pertinent labs & imaging results that were available during my care of the patient were reviewed by me and considered in my medical decision making (see MDM for details).  Medications Ordered in ED Medications  etomidate (AMIDATE) injection 10 mg (10 mg Intravenous Given 10/03/23 0842)  fentaNYL (SUBLIMAZE) injection 50 mcg (50 mcg Intravenous Given 10/03/23 0840)  Procedures .Critical Care  Performed by: Lonell Grandchild, MD Authorized by: Lonell Grandchild, MD   Critical care provider statement:    Critical care time (minutes):  30   Critical care was necessary to treat or prevent imminent or life-threatening deterioration of the following conditions:  Cardiac failure   Critical care was time spent personally by me on the following activities:  Development of treatment plan with patient or surrogate, discussions with consultants, evaluation of patient's response to treatment, examination of patient, ordering and review of laboratory studies, ordering and review of radiographic studies, ordering and performing treatments and interventions, pulse oximetry, re-evaluation of patient's condition and review of old charts .Cardioversion  Date/Time: 10/03/2023 9:54 AM  Performed by: Lonell Grandchild, MD Authorized by: Lonell Grandchild, MD   Consent:     Consent obtained:  Verbal and written   Consent given by:  Patient   Risks discussed:  Cutaneous burn, death, induced arrhythmia and pain   Alternatives discussed:  No treatment, anti-coagulation medication, delayed treatment, rate-control medication, alternative treatment and observation Pre-procedure details:    Cardioversion basis:  Elective   Rhythm:  Atrial fibrillation   Electrode placement:  Anterior-posterior Patient sedated: Yes. Refer to sedation procedure documentation for details of sedation.  Attempt one:    Cardioversion mode:  Synchronous   Shock (Joules):  120   Shock outcome:  No change in rhythm Attempt two:    Cardioversion mode:  Synchronous   Shock (Joules):  200   Shock outcome:  Conversion to normal sinus rhythm Post-procedure details:    Patient status:  Awake   Patient tolerance of procedure:  Tolerated well, no immediate complications .Sedation  Date/Time: 10/03/2023 9:55 AM  Performed by: Lonell Grandchild, MD Authorized by: Lonell Grandchild, MD   Consent:    Consent obtained:  Verbal   Consent given by:  Patient   Risks discussed:  Allergic reaction, dysrhythmia, inadequate sedation, nausea, prolonged hypoxia resulting in organ damage, prolonged sedation necessitating reversal, respiratory compromise necessitating ventilatory assistance and intubation and vomiting   Alternatives discussed:  Analgesia without sedation, anxiolysis and regional anesthesia Universal protocol:    Procedure explained and questions answered to patient or proxy's satisfaction: yes     Relevant documents present and verified: yes     Test results available: yes     Imaging studies available: yes     Required blood products, implants, devices, and special equipment available: yes     Site/side marked: yes     Immediately prior to procedure, a time out was called: yes     Patient identity confirmed:  Verbally with patient and arm band Indications:    Procedure performed:   Cardioversion   Procedure necessitating sedation performed by:  Physician performing sedation Pre-sedation assessment:    Time since last food or drink:  2 hr   NPO status caution: urgency dictates proceeding with non-ideal NPO status     ASA classification: class 2 - patient with mild systemic disease     Mouth opening:  3 or more finger widths   Thyromental distance:  4 finger widths   Mallampati score:  I - soft palate, uvula, fauces, pillars visible   Neck mobility: normal     Pre-sedation assessments completed and reviewed: airway patency, cardiovascular function, hydration status, mental status, nausea/vomiting, pain level, respiratory function and temperature   A pre-sedation assessment was completed prior to the start of the procedure Immediate pre-procedure details:    Reassessment:  Patient reassessed immediately prior to procedure     Reviewed: vital signs, relevant labs/tests and NPO status     Verified: bag valve mask available, emergency equipment available, intubation equipment available, IV patency confirmed, oxygen available and suction available   Procedure details (see MAR for exact dosages):    Preoxygenation:  Nasal cannula   Sedation:  Etomidate   Intended level of sedation: deep   Analgesia:  Fentanyl   Intra-procedure monitoring:  Blood pressure monitoring, cardiac monitor, continuous pulse oximetry, frequent LOC assessments, frequent vital sign checks and continuous capnometry   Intra-procedure events: none     Total Provider sedation time (minutes):  15 Post-procedure details:   A post-sedation assessment was completed following the completion of the procedure.   Attendance: Constant attendance by certified staff until patient recovered     Recovery: Patient returned to pre-procedure baseline     Post-sedation assessments completed and reviewed: airway patency, cardiovascular function, hydration status, mental status, nausea/vomiting, pain level, respiratory  function and temperature     Patient is stable for discharge or admission: yes     Procedure completion:  Tolerated well, no immediate complications   (including critical care time)  Medical Decision Making / ED Course   MDM:  38 year old male presenting to the emergency department with palpitations and chest pain.  EKG shows atrial fibrillation with rapid ventricular response.  Suspect this is likely cause of his symptoms.  He reports this is how he felt during previous episodes of atrial fibrillation.  Had risks and benefits discussion with patient regarding cardioversion and sedation versus rate control.  Discussed risks of cardioversion including stroke, conversion to dangerous rhythm or failure to cardiovert, death.  Discussed risks of sedation including allergic reaction, aspiration, intubation, death.  Patient is certain his symptoms were not present yesterday and reports he has always been symptomatic from prior episodes.  He is agreeable to proceed with cardioversion.  His CHA2DS2-VASc score is 1 given his history of hypertension and gender.  Will discuss anticoagulation with the patient.  Low concern chest pain is due to other process such as pulmonary embolism, dissection, ACS.  Will check chest x-ray, troponin.  Will reassess.  Clinical Course as of 10/03/23 0956  Thu Oct 03, 2023  0856 Cardioversion completed successfully.  See his procedure note for details.  Patient reports his chest pain and shortness of breath have resolved [WS]  [phone] Nursing noted that the patient has a very small superficial burn to his chest at the superior aspect of the cardioversion pad site.  Discussed with the patient.  Should heal on its own.  Had risk benefits discussion regarding initiation of anticoagulation for stroke prevention in the setting of atrial fibrillation.  He would prefer to wait until seeing a cardiologist.  He understands that he should return immediately for any stroke symptoms. He  has been able to tolerate PO and ambulate without difficulty. Family will pick him up given sedation. Will discharge patient to home. All questions answered. Patient comfortable with plan of discharge. Return precautions discussed with patient and specified on the after visit summary.  [WS]    Clinical Course User Index [WS] Lonell Grandchild, MD     Additional history obtained:  -External records from outside source obtained and reviewed including: Chart review including previous notes, labs, imaging, consultation notes including prior ER visits    Lab Tests: -I ordered, reviewed, and interpreted labs.   The pertinent results include:   Labs Reviewed  COMPREHENSIVE METABOLIC  PANEL - Abnormal; Notable for the following components:      Result Value   Glucose, Bld 154 (*)    All other components within normal limits  CBC WITH DIFFERENTIAL/PLATELET  MAGNESIUM  TROPONIN I (HIGH SENSITIVITY)    Notable for mild hyperglycemia post prandial   EKG   EKG Interpretation Date/Time:  Thursday October 03 2023 08:47:30 EST Ventricular Rate:  102 PR Interval:    QRS Duration:  80 QT Interval:  297 QTC Calculation: 387 R Axis:   12  Text Interpretation: Sinus tachycardia Low voltage, precordial leads Abnormal R-wave progression, early transition Baseline wander in lead(s) V4 V5 Confirmed by Alvino Blood ([id number]) on 10/03/2023 9:12:05 AM         Imaging Studies ordered: I ordered imaging studies including CXR On my interpretation imaging demonstrates no acute process I independently visualized and interpreted imaging. I agree with the radiologist interpretation   Medicines ordered and prescription drug management: Meds ordered this encounter  Medications   etomidate (AMIDATE) injection 10 mg   fentaNYL (SUBLIMAZE) injection 50 mcg    -I have reviewed the patients home medicines and have made adjustments as needed   Cardiac Monitoring: The patient was maintained on a  cardiac monitor.  I personally viewed and interpreted the cardiac monitored which showed an underlying rhythm of: afib with RVR  Social Determinants of Health:  Diagnosis or treatment significantly limited by social determinants of health: obesity   Reevaluation: After the interventions noted above, I reevaluated the patient and found that their symptoms have resolved  Co morbidities that complicate the patient evaluation  Past Medical History:  Diagnosis Date   History of atrial fibrillation       Dispostion: Disposition decision including need for hospitalization was considered, and patient discharged from emergency department.    Final Clinical Impression(s) / ED Diagnoses Final diagnoses:  Atrial fibrillation with RVR (HCC)     This chart was dictated using voice recognition software.  Despite best efforts to proofread,  errors can occur which can change the documentation meaning.    Lonell Grandchild, MD 10/03/23 [phone]

## 2023-10-11 ENCOUNTER — Ambulatory Visit (HOSPITAL_COMMUNITY)
Admission: RE | Admit: 2023-10-11 | Discharge: 2023-10-11 | Disposition: A | Discharge status: Home / Self Care | Payer: BC Managed Care – PPO | Source: Ambulatory Visit | Attending: Internal Medicine | Admitting: Internal Medicine

## 2023-10-11 VITALS — BP 110/76 | HR 65 | Ht 72.0 in | Wt 394.8 lb

## 2023-10-11 DIAGNOSIS — Z7901 Long term (current) use of anticoagulants: Secondary | ICD-10-CM | POA: Diagnosis not present

## 2023-10-11 DIAGNOSIS — I480 Paroxysmal atrial fibrillation: Secondary | ICD-10-CM | POA: Diagnosis not present

## 2023-10-11 DIAGNOSIS — E669 Obesity, unspecified: Secondary | ICD-10-CM | POA: Insufficient documentation

## 2023-10-11 DIAGNOSIS — I10 Essential (primary) hypertension: Secondary | ICD-10-CM | POA: Diagnosis not present

## 2023-10-11 DIAGNOSIS — Z6841 Body Mass Index (BMI) 40.0 and over, adult: Secondary | ICD-10-CM | POA: Insufficient documentation

## 2023-10-11 MED ORDER — APIXABAN 5 MG PO TABS: 5.0000 mg | ORAL_TABLET | Freq: Two times a day (BID) | ORAL | 0 refills | Status: DC

## 2023-10-11 NOTE — Patient Instructions (Signed)
Eliquis 5mg  twice a day for 30 days then stop  Hold aspirin while taking Eliquis   Kardia mobile (1 lead) or samsung watch to monitor ECG

## 2023-10-11 NOTE — Progress Notes (Signed)
Primary Care Physician: Billie Lade, MD Primary Cardiologist: None Electrophysiologist: None     Referring Physician: ED     Karl Nicholson is a 38 y.o. male with a history of obesity, HTN, and atrial fibrillation who presents for consultation in the [facility]. ED visit on 10/03/23 for Afib with RVR; s/p successful DCCV in ED. Patient declined OAC. Patient has a CHADS2VASC score of 1.  On evaluation today, he is currently in NSR. He noticed palpitations briefly yesterday in the morning that was different than episode on 1/16. He does not drink alcohol. He has sweet tea sometimes with food. He does snore per wife. He rarely has caffeine.   Today, he denies symptoms of chest pain, shortness of breath, orthopnea, PND, lower extremity edema, dizziness, presyncope, syncope, snoring, daytime somnolence, bleeding, or neurologic sequela. The patient is tolerating medications without difficulties and is otherwise without complaint today.    Atrial Fibrillation Risk Factors:  he does have symptoms or diagnosis of sleep apnea.  he has a BMI of Body mass index is 53.54 kg/m.Marland Kitchen Filed Weights   10/11/23 0947  Weight: (!) 179.1 kg    Current Outpatient Medications  Medication Sig Dispense Refill   apixaban (ELIQUIS) 5 MG TABS tablet Take 1 tablet (5 mg total) by mouth 2 (two) times daily. 60 tablet 0   aspirin EC 81 MG tablet Take 81 mg by mouth daily. Swallow whole.     olmesartan (BENICAR) 20 MG tablet Take 1 tablet (20 mg total) by mouth daily. 30 tablet 2   No current facility-administered medications for this encounter.    Atrial Fibrillation Management history:  Previous antiarrhythmic drugs: none Previous cardioversions: 10/03/23 Previous ablations: none Anticoagulation history: none   ROS- All systems are reviewed and negative except as per the HPI above.  Physical Exam: BP 110/76   Pulse 65   Ht 6' (1.829 m)   Wt (!) 179.1 kg   BMI  53.54 kg/m   GEN: Well nourished, well developed in no acute distress NECK: No JVD; No carotid bruits CARDIAC: Regular rate and rhythm, no murmurs, rubs, gallops RESPIRATORY:  Clear to auscultation without rales, wheezing or rhonchi  ABDOMEN: Soft, non-tender, non-distended EXTREMITIES:  No edema; No deformity   EKG today demonstrates  Vent. rate 65 BPM PR interval 164 ms QRS duration 92 ms QT/QTcB 380/395 ms P-R-T axes 41 11 6 Normal sinus rhythm Inferior infarct , age undetermined Abnormal ECG When compared with ECG of 03-Oct-2023 09:15, PREVIOUS ECG IS PRESENT  Echo 03/22/2019 demonstrated   1. The left ventricle has hyperdynamic systolic function, with an  ejection fraction of >65%. The cavity size was normal. There is mildly  increased left ventricular wall thickness. Left ventricular diastolic  function could not be evaluated secondary to  atrial fibrillation.   2. The mitral valve is grossly normal.   3. The tricuspid valve is grossly normal.   4. The aortic valve is tricuspid. No stenosis of the aortic valve.   5. Hyperdynamic LV function; mild LVH.    ASSESSMENT & PLAN CHA2DS2-VASc Score = 1  The patient's score is based upon: CHF History: 0 HTN History: 1 Diabetes History: 0 Stroke History: 0 Vascular Disease History: 0 Age Score: 0 Gender Score: 0       ASSESSMENT AND PLAN: Paroxysmal Atrial Fibrillation (ICD10:  I48.0) The patient's CHA2DS2-VASc score is 1, indicating a 0.6% annual risk of stroke.    He is in NSR.  Education provided about Afib. Rhythm monitoring device recommended. We discussed rate control in the future if he has recurrence of palpitation episodes. He declined cardiac monitor today, so again emphasized the benefit of rhythm monitoring at home. Will order sleep study - patient believes he might have sleep apnea. Echo done in 2020. We discussed goal of 10% weight reduction to help with Afib burden and overall quality of life. We discussed  risks vs benefits of anticoagulation following DCCV in ED on 10/03/23. After discussion, patient elects to begin Eliquis 5 mg BID for 1 month and not continue. This is reasonable due to his score of 1. He will temporarily stop ASA while taking Eliquis for 1 month.      Follow up Afib clinic prn.    Lake Bells, PA-C  Afib Clinic [facility] [address] [place], [place] [address] [phone]

## 2023-11-04 DIAGNOSIS — R0683 Snoring: Secondary | ICD-10-CM | POA: Diagnosis not present

## 2023-11-29 ENCOUNTER — Ambulatory Visit: Payer: BC Managed Care – PPO | Admitting: Internal Medicine

## 2023-11-29 ENCOUNTER — Encounter: Payer: Self-pay | Admitting: Internal Medicine

## 2023-11-29 VITALS — BP 128/72 | HR 88 | Ht 72.0 in | Wt 398.4 lb

## 2023-11-29 DIAGNOSIS — Z6841 Body Mass Index (BMI) 40.0 and over, adult: Secondary | ICD-10-CM

## 2023-11-29 DIAGNOSIS — I480 Paroxysmal atrial fibrillation: Secondary | ICD-10-CM

## 2023-11-29 DIAGNOSIS — R7303 Prediabetes: Secondary | ICD-10-CM | POA: Diagnosis not present

## 2023-11-29 DIAGNOSIS — I10 Essential (primary) hypertension: Secondary | ICD-10-CM | POA: Diagnosis not present

## 2023-11-29 DIAGNOSIS — Z9189 Other specified personal risk factors, not elsewhere classified: Secondary | ICD-10-CM | POA: Insufficient documentation

## 2023-11-29 MED ORDER — OZEMPIC (0.25 OR 0.5 MG/DOSE) 2 MG/3ML ~~LOC~~ SOPN: 0.5000 mg | PEN_INJECTOR | SUBCUTANEOUS | 0 refills | Status: AC

## 2023-11-29 MED ORDER — OLMESARTAN MEDOXOMIL 20 MG PO TABS: 20.0000 mg | ORAL_TABLET | Freq: Every day | ORAL | 3 refills | Status: DC

## 2023-11-29 NOTE — Progress Notes (Signed)
 Established Patient Office Visit  Subjective   Patient ID: Karl Nicholson, male    DOB: 10-05-1985  Age: 38 y.o. MRN: [id number]  Chief Complaint  Patient presents with   Hypertension    Three month follow up    Karl Nicholson returns to care today for routine follow-up.  He was last evaluated by me in December 2024.  Olmesartan 20 mg daily was started at that time for newly diagnosed hypertension.  In the interim, he presented to the emergency department on 1/16 endorsing chest tightness.  Found to be in A-fib with RVR.  Underwent successful DCCV in ED.  He established care with cardiology on 1/24.  There have otherwise been no acute interval events.  Karl Nicholson reports feeling well today.  He is asymptomatic and has no acute concerns to discuss.  Past Medical History:  Diagnosis Date   History of atrial fibrillation    History reviewed. No pertinent surgical history. 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 unknown Did it involve sudden or severe rash/hives, skin peeling, or any reaction on the inside of your mouth or nose? unknown Did you need to seek medical attention at a hospital or doctor's office? unknown When did it last happen?   childhood    If all above answers are "NO", may proceed with cephalosporin use.    Review of Systems  Constitutional:  Negative for chills and fever.  HENT:  Negative for sore throat.   Respiratory:  Negative for cough and shortness of breath.   Cardiovascular:  Negative for chest pain, palpitations and leg  swelling.  Gastrointestinal:  Negative for abdominal pain, blood in stool, constipation, diarrhea, nausea and vomiting.  Genitourinary:  Negative for dysuria and hematuria.  Musculoskeletal:  Negative for myalgias.  Skin:  Negative for itching and rash.  Neurological:  Negative for dizziness and headaches.  Psychiatric/Behavioral:  Negative for depression and suicidal ideas.      Objective:     BP 128/72   Pulse 88   Ht 6' (1.829 m)   Wt (!) 398 lb 6.4 oz (180.7 kg)   SpO2 95%   BMI 54.03 kg/m  BP Readings from Last 3 Encounters:  11/29/23 128/72  10/11/23 110/76  10/03/23 117/84   Physical Exam Vitals reviewed.  Constitutional:      General: He is not in acute distress.    Appearance: Normal appearance. He is obese. He is not ill-appearing.  HENT:     Head: Normocephalic and atraumatic.     Right Ear: External ear normal.     Left Ear: External ear normal.     Nose: Nose normal. No congestion or rhinorrhea.     Mouth/Throat:     Mouth: Mucous membranes are moist.     Pharynx: Oropharynx is clear.  Eyes:     General: No scleral icterus.    Extraocular Movements: Extraocular movements intact.     Conjunctiva/sclera: Conjunctivae normal.  Pupils: Pupils are equal, round, and reactive to light.  Cardiovascular:     Rate and Rhythm: Normal rate and regular rhythm.     Pulses: Normal pulses.     Heart sounds: Normal heart sounds. No murmur heard. Pulmonary:     Effort: Pulmonary effort is normal.     Breath sounds: Normal breath sounds. No wheezing, rhonchi or rales.  Abdominal:     General: Abdomen is flat. Bowel sounds are normal. There is no distension.     Palpations: Abdomen is soft.     Tenderness: There is no abdominal tenderness.  Musculoskeletal:        General: No swelling or deformity. Normal range of motion.     Cervical back: Normal range of motion.  Skin:    General: Skin is warm and dry.     Capillary Refill: Capillary refill takes less than 2  seconds.  Neurological:     General: No focal deficit present.     Mental Status: He is alert and oriented to person, place, and time.     Motor: No weakness.  Psychiatric:        Mood and Affect: Mood normal.        Behavior: Behavior normal.        Thought Content: Thought content normal.   Last CBC Lab Results  Component Value Date   WBC 6.5 10/03/2023   HGB 16.3 10/03/2023   HCT 48.7 10/03/2023   MCV 84.7 10/03/2023   MCH 28.3 10/03/2023   RDW 12.9 10/03/2023   PLT 228 10/03/2023   Last metabolic panel Lab Results  Component Value Date   GLUCOSE 154 (H) 10/03/2023   NA 136 10/03/2023   K 4.2 10/03/2023   CL 100 10/03/2023   CO2 27 10/03/2023   BUN 12 10/03/2023   CREATININE 1.11 10/03/2023   GFRNONAA >60 10/03/2023   CALCIUM 9.0 10/03/2023   PROT 7.8 10/03/2023   ALBUMIN 4.1 10/03/2023   LABGLOB 2.7 07/26/2023   BILITOT 1.0 10/03/2023   ALKPHOS 64 10/03/2023   AST 26 10/03/2023   ALT 34 10/03/2023   ANIONGAP 9 10/03/2023   Last lipids Lab Results  Component Value Date   CHOL 170 07/26/2023   HDL 40 07/26/2023   LDLCALC 95 07/26/2023   TRIG 202 (H) 07/26/2023   CHOLHDL 4.3 07/26/2023   Last hemoglobin A1c Lab Results  Component Value Date   HGBA1C 5.8 (H) 07/26/2023   Last thyroid functions Lab Results  Component Value Date   TSH 2.000 07/26/2023   Last vitamin D Lab Results  Component Value Date   VD25OH 34.3 07/26/2023   Last vitamin B12 and Folate Lab Results  Component Value Date   VITAMINB12 332 07/26/2023   FOLATE 5.1 07/26/2023     Assessment & Plan:   Problem List Items Addressed This Visit       Essential hypertension   Recent diagnosis.  Olmesartan 20 mg daily was started at his last appointment.  BP is adequately controlled on this regimen.  No additional medication changes are indicated today.      Paroxysmal atrial fibrillation [facility])   ED presentation on 1/16 with chief complaint of chest tightness.  Found to be in  A-fib with RVR.  Underwent DCCV in the emergency department.  Seen by cardiology on 1/24.  He elected to take Eliquis x 1 month.  He is currently holding aspirin while taking Eliquis and will resume upon completion.  Regular rate and rhythm  detected on exam today.      Morbid obesity with BMI of 50.0-59.9, adult (HCC) - Primary   Current weight 398 pounds.  BMI 54.  We have previously discussed lifestyle modifications aimed at weight loss.  He carries comorbid conditions of prediabetes and is at risk for obstructive sleep apnea.  Sleep study is pending.  We discussed the imperative nature to lose weight.  -Through shared decision making, Ozempic has been prescribed today given comorbid conditions.  Follow-up in 3 months for reassessment.      At risk for obstructive sleep apnea   Reports that he has recently been referred to sleep medicine.  OSA eval is pending.       Return in about 3 months (around 02/29/2024).    Billie Lade, MD

## 2023-11-29 NOTE — Patient Instructions (Signed)
 It was a pleasure to see you today.  Thank you for giving [place] the opportunity to be involved in your care.  Below is a brief recap of your visit and next steps.  We will plan to see you again in 3 months.  Summary Start Ozempic  Continue to focus on dietary changes aimed at weight loss Follow up in 3 months

## 2023-11-29 NOTE — Assessment & Plan Note (Signed)
 Current weight 398 pounds.  BMI 54.  We have previously discussed lifestyle modifications aimed at weight loss.  He carries comorbid conditions of prediabetes and is at risk for obstructive sleep apnea.  Sleep study is pending.  We discussed the imperative nature to lose weight.  -Through shared decision making, Ozempic has been prescribed today given comorbid conditions.  Follow-up in 3 months for reassessment.

## 2023-11-29 NOTE — Assessment & Plan Note (Signed)
 ED presentation on 1/16 with chief complaint of chest tightness.  Found to be in A-fib with RVR.  Underwent DCCV in the emergency department.  Seen by cardiology on 1/24.  He elected to take Eliquis x 1 month.  He is currently holding aspirin while taking Eliquis and will resume upon completion.  Regular rate and rhythm detected on exam today.

## 2023-11-29 NOTE — Assessment & Plan Note (Signed)
 Reports that he has recently been referred to sleep medicine.  OSA eval is pending.

## 2023-11-29 NOTE — Assessment & Plan Note (Signed)
 Recent diagnosis.  Olmesartan 20 mg daily was started at his last appointment.  BP is adequately controlled on this regimen.  No additional medication changes are indicated today.

## 2023-12-03 ENCOUNTER — Other Ambulatory Visit (HOSPITAL_COMMUNITY): Payer: Self-pay

## 2023-12-03 ENCOUNTER — Telehealth: Payer: Self-pay | Admitting: Pharmacy Technician

## 2023-12-03 NOTE — Telephone Encounter (Signed)
 Pharmacy Patient Advocate Encounter   Received notification from CoverMyMeds that prior authorization for Ozempic (0.25 or 0.5 MG/DOSE) 2MG /3ML pen-injectors is required/requested.   Insurance verification completed.   The patient is insured through [employer] .   Per test claim: PA required; PA submitted to above mentioned insurance via CoverMyMeds Key/confirmation #/EOC [id number] Status is pending

## 2023-12-03 NOTE — Telephone Encounter (Signed)
 Pharmacy Patient Advocate Encounter  Received notification from [facility] that Prior Authorization for [facility] 0.25MG  OR 0.5MG /DOSE has been DENIED.  Full denial letter will be uploaded to the media tab. See denial reason below.   PA #/Case ID/Reference #: [id number]

## 2023-12-06 ENCOUNTER — Other Ambulatory Visit (HOSPITAL_COMMUNITY): Payer: Self-pay

## 2024-02-28 ENCOUNTER — Ambulatory Visit: Admitting: Internal Medicine

## 2024-02-28 ENCOUNTER — Encounter: Payer: Self-pay | Admitting: Internal Medicine

## 2024-02-28 VITALS — BP 137/82 | HR 106 | Ht 72.0 in | Wt >= 6400 oz

## 2024-02-28 DIAGNOSIS — I10 Essential (primary) hypertension: Secondary | ICD-10-CM

## 2024-02-28 DIAGNOSIS — Z9189 Other specified personal risk factors, not elsewhere classified: Secondary | ICD-10-CM

## 2024-02-28 DIAGNOSIS — Z6841 Body Mass Index (BMI) 40.0 and over, adult: Secondary | ICD-10-CM

## 2024-02-28 MED ORDER — OLMESARTAN MEDOXOMIL 20 MG PO TABS
20.0000 mg | ORAL_TABLET | Freq: Every day | ORAL | 3 refills | Status: AC
Start: 2024-02-28 — End: 2025-02-22

## 2024-02-28 NOTE — Progress Notes (Signed)
 Established Patient Office Visit  Subjective   Patient ID: Karl Nicholson, male    DOB: 1986-08-07  Age: 38 y.o. MRN: [id number]  Chief Complaint  Patient presents with   Care Management    Three month follow up   Karl Nicholson returns to care today for routine follow-up.  He was last evaluated by me on 3/14 at which time Ozempic  was prescribed in the setting of morbid obesity with comorbid conditions of prediabetes and suspected obstructive sleep apnea.  39-month follow-up was arranged for reassessment.  In the interim he has been evaluated by sleep medicine.  There have otherwise been no acute interval events.  Today he reports feeling well and has no acute concerns to discuss.  Past Medical History:  Diagnosis Date   History of atrial fibrillation    History reviewed. No pertinent surgical history. 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 unknown Did it involve sudden or severe rash/hives, skin peeling, or any reaction on the inside of your mouth or nose? unknown Did you need to seek medical attention at a hospital or doctor's office? unknown When did it last happen?   childhood    If all above answers are NO, may proceed with cephalosporin use.    Review of Systems  Constitutional:  Negative for chills and fever.  HENT:  Negative for sore throat.   Respiratory:  Negative for cough and shortness of breath.   Cardiovascular:  Negative for chest pain, palpitations and leg swelling.  Gastrointestinal:  Negative for abdominal pain, blood in  stool, constipation, diarrhea, nausea and vomiting.  Genitourinary:  Negative for dysuria and hematuria.  Musculoskeletal:  Negative for myalgias.  Skin:  Negative for itching and rash.  Neurological:  Negative for dizziness and headaches.  Psychiatric/Behavioral:  Negative for depression and suicidal ideas.      Objective:     BP 137/82   Pulse (!) 106   Ht 6' (1.829 m)   Wt (!) 413 lb 12.8 oz (187.7 kg)   SpO2 97%   BMI 56.12 kg/m  BP Readings from Last 3 Encounters:  02/28/24 137/82  11/29/23 128/72  10/11/23 110/76   Physical Exam Vitals reviewed.  Constitutional:      General: He is not in acute distress.    Appearance: Normal appearance. He is obese. He is not ill-appearing.  HENT:     Head: Normocephalic and atraumatic.     Right Ear: External ear normal.     Left Ear: External ear normal.     Nose: Nose normal. No congestion or rhinorrhea.     Mouth/Throat:     Mouth: Mucous membranes are moist.     Pharynx: Oropharynx is clear.   Eyes:     General: No scleral icterus.    Extraocular Movements: Extraocular movements intact.     Conjunctiva/sclera: Conjunctivae normal.     Pupils: Pupils are equal, round, and reactive to light.    Cardiovascular:  Rate and Rhythm: Normal rate and regular rhythm.     Pulses: Normal pulses.     Heart sounds: Normal heart sounds. No murmur heard. Pulmonary:     Effort: Pulmonary effort is normal.     Breath sounds: Normal breath sounds. No wheezing, rhonchi or rales.  Abdominal:     General: Abdomen is flat. Bowel sounds are normal. There is no distension.     Palpations: Abdomen is soft.     Tenderness: There is no abdominal tenderness.   Musculoskeletal:        General: No swelling or deformity. Normal range of motion.     Cervical back: Normal range of motion.   Skin:    General: Skin is warm and dry.     Capillary Refill: Capillary refill takes less than 2 seconds.   Neurological:     General: No focal  deficit present.     Mental Status: He is alert and oriented to person, place, and time.     Motor: No weakness.   Psychiatric:        Mood and Affect: Mood normal.        Behavior: Behavior normal.        Thought Content: Thought content normal.   Last CBC Lab Results  Component Value Date   WBC 6.5 10/03/2023   HGB 16.3 10/03/2023   HCT 48.7 10/03/2023   MCV 84.7 10/03/2023   MCH 28.3 10/03/2023   RDW 12.9 10/03/2023   PLT 228 10/03/2023   Last metabolic panel Lab Results  Component Value Date   GLUCOSE 154 (H) 10/03/2023   NA 136 10/03/2023   K 4.2 10/03/2023   CL 100 10/03/2023   CO2 27 10/03/2023   BUN 12 10/03/2023   CREATININE 1.11 10/03/2023   GFRNONAA >60 10/03/2023   CALCIUM 9.0 10/03/2023   PROT 7.8 10/03/2023   ALBUMIN 4.1 10/03/2023   LABGLOB 2.7 07/26/2023   BILITOT 1.0 10/03/2023   ALKPHOS 64 10/03/2023   AST 26 10/03/2023   ALT 34 10/03/2023   ANIONGAP 9 10/03/2023   Last lipids Lab Results  Component Value Date   CHOL 170 07/26/2023   HDL 40 07/26/2023   LDLCALC 95 07/26/2023   TRIG 202 (H) 07/26/2023   CHOLHDL 4.3 07/26/2023   Last hemoglobin A1c Lab Results  Component Value Date   HGBA1C 5.8 (H) 07/26/2023   Last thyroid  functions Lab Results  Component Value Date   TSH 2.000 07/26/2023   Last vitamin D  Lab Results  Component Value Date   VD25OH 34.3 07/26/2023   Last vitamin B12 and Folate Lab Results  Component Value Date   VITAMINB12 332 07/26/2023   FOLATE 5.1 07/26/2023     Assessment & Plan:   Problem List Items Addressed This Visit       Essential hypertension   Remains adequately controlled with olmesartan  20 mg daily.      Morbid obesity with BMI of 50.0-59.9, adult (HCC)   Current weight 413 pounds.  BMI 56.1.  He has gained 15 pounds since his last appointment.  He has increased his exercise regularity but endorses poor dietary habits.  We discussed the importance of adhering to lifestyle modifications  aimed at weight loss.  Ozempic  was prescribed at his last appointment but ultimately was not covered.  Additional treatment options reviewed today.  He is potentially a good candidate for Zepbound, pending completion of sleep study for suspected OSA.  No medication was prescribed today.  Recommend  adhering to healthy dietary and exercise habits.  Will arrange follow-up in 3 months for weight management.  Consider Zepbound at that time, pending completion of sleep study, or Contrave if not covered.      At risk for obstructive sleep apnea - Primary   Recently evaluated by sleep medicine.  Sleep study is still pending.  He is having a difficult time scheduling an appointment to complete the study because of work obligations but will attempt to get it scheduled in the near future.      Return in about 3 months (around 05/30/2024).   Tobi Fortes, MD

## 2024-02-28 NOTE — Assessment & Plan Note (Signed)
 Current weight 413 pounds.  BMI 56.1.  He has gained 15 pounds since his last appointment.  He has increased his exercise regularity but endorses poor dietary habits.  We discussed the importance of adhering to lifestyle modifications aimed at weight loss.  Ozempic  was prescribed at his last appointment but ultimately was not covered.  Additional treatment options reviewed today.  He is potentially a good candidate for Zepbound, pending completion of sleep study for suspected OSA.  No medication was prescribed today.  Recommend adhering to healthy dietary and exercise habits.  Will arrange follow-up in 3 months for weight management.  Consider Zepbound at that time, pending completion of sleep study, or Contrave if not covered.

## 2024-02-28 NOTE — Assessment & Plan Note (Signed)
 Recently evaluated by sleep medicine.  Sleep study is still pending.  He is having a difficult time scheduling an appointment to complete the study because of work obligations but will attempt to get it scheduled in the near future.

## 2024-02-28 NOTE — Patient Instructions (Signed)
 It was a pleasure to see you today.  Thank you for giving us  the opportunity to be involved in your care.  Below is a brief recap of your visit and next steps.  We will plan to see you again in 3 months.  Summary No medication changes today Will arrange follow up in 3 months for weight check and to discuss medication options for weight loss. Try to complete your sleep study in the interim to establish the suspected diagnosis of sleep apnea.

## 2024-02-28 NOTE — Assessment & Plan Note (Signed)
 Remains adequately controlled with olmesartan  20 mg daily.

## 2024-05-29 ENCOUNTER — Ambulatory Visit

## 2024-05-29 VITALS — BP 133/89 | HR 98 | Ht 71.0 in | Wt 394.0 lb

## 2024-05-29 DIAGNOSIS — G4711 Idiopathic hypersomnia with long sleep time: Secondary | ICD-10-CM | POA: Diagnosis not present

## 2024-05-29 DIAGNOSIS — Z6841 Body Mass Index (BMI) 40.0 and over, adult: Secondary | ICD-10-CM | POA: Diagnosis not present

## 2024-05-29 NOTE — Progress Notes (Signed)
 Established Patient Office Visit  Subjective   Patient ID: Karl Nicholson, male    DOB: 1986/03/05  Age: 38 y.o. MRN: [id number]  Chief Complaint  Patient presents with   Medical Management of Chronic Issues    Pt here for follow up    HPI Discussed the use of AI scribe software for clinical note transcription with the patient, who gave verbal consent to proceed.  History of Present Illness   Karl Nicholson is a 38 year old male who presents for a three-month follow-up visit.  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 - Frequent consumption of fast food for convenience - Attempts to pack healthier lunches with a lunch box, but finds it challenging due to busy mornings with three children  Antihyperglycemic medication access - Prescribed Ozempic  but unable to afford due to lack of insurance coverage  Hypertension management - Currently taking antihypertensive medication - Believes current regimen is sufficient      Patient Active Problem List   Diagnosis Date Noted   At risk for obstructive sleep apnea 11/29/2023   Paroxysmal atrial fibrillation (HCC) 10/11/2023   Prediabetes 08/31/2023   Morbid obesity with BMI of 50.0-59.9, adult (HCC) 07/26/2023   Essential hypertension 07/26/2023   Numbness and tingling of right hand 07/26/2023   History of atrial fibrillation 03/21/2019    ROS    Objective:     BP 133/89   Pulse 98   Ht 5' 11 (1.803 m)   Wt (!) 394 lb (178.7 kg)   SpO2 91%   BMI 54.95 kg/m  BP Readings from Last 3 Encounters:  05/29/24 133/89  02/28/24 137/82  11/29/23  128/72   Wt Readings from Last 3 Encounters:  05/29/24 (!) 394 lb (178.7 kg)  02/28/24 (!) 413 lb 12.8 oz (187.7 kg)  11/29/23 (!) 398 lb 6.4 oz (180.7 kg)     Physical Exam Vitals and nursing note reviewed.  Constitutional:      Appearance: Normal appearance. He is obese.  HENT:     Head: Normocephalic.     Right Ear: Tympanic membrane, ear canal and external ear normal.     Left Ear: Tympanic membrane, ear canal and external ear normal.     Nose: Nose normal.     Mouth/Throat:     Mouth: Mucous membranes are moist.     Pharynx: Oropharynx is clear.  Eyes:     Pupils: Pupils are equal, round, and reactive to light.  Cardiovascular:     Rate and Rhythm: Normal rate and regular rhythm.  Pulmonary:     Effort: Pulmonary effort is normal.     Breath sounds: Normal breath sounds.  Musculoskeletal:     Cervical back: Normal range of motion and neck supple.  Skin:    General: Skin is warm and dry.  Neurological:     Mental Status: He is alert and oriented to person, place, and time.  Psychiatric:        Mood and Affect: Mood normal.        Thought Content: Thought content normal.     No results found for any visits on 05/29/24.  Last  CBC Lab Results  Component Value Date   WBC 6.5 10/03/2023   HGB 16.3 10/03/2023   HCT 48.7 10/03/2023   MCV 84.7 10/03/2023   MCH 28.3 10/03/2023   RDW 12.9 10/03/2023   PLT 228 10/03/2023   Last metabolic panel Lab Results  Component Value Date   GLUCOSE 154 (H) 10/03/2023   NA 136 10/03/2023   K 4.2 10/03/2023   CL 100 10/03/2023   CO2 27 10/03/2023   BUN 12 10/03/2023   CREATININE 1.11 10/03/2023   GFRNONAA >60 10/03/2023   CALCIUM 9.0 10/03/2023   PROT 7.8 10/03/2023   ALBUMIN 4.1 10/03/2023   LABGLOB 2.7 07/26/2023   BILITOT 1.0 10/03/2023   ALKPHOS 64 10/03/2023   AST 26 10/03/2023   ALT 34 10/03/2023   ANIONGAP 9 10/03/2023   Last lipids Lab Results  Component Value Date   CHOL 170 07/26/2023   HDL 40  07/26/2023   LDLCALC 95 07/26/2023   TRIG 202 (H) 07/26/2023   CHOLHDL 4.3 07/26/2023   Last hemoglobin A1c Lab Results  Component Value Date   HGBA1C 5.8 (H) 07/26/2023   Last thyroid  functions Lab Results  Component Value Date   TSH 2.000 07/26/2023   Last vitamin D  Lab Results  Component Value Date   VD25OH 34.3 07/26/2023   Last vitamin B12 and Folate Lab Results  Component Value Date   VITAMINB12 332 07/26/2023   FOLATE 5.1 07/26/2023      The ASCVD Risk score (Arnett DK, et al., 2019) failed to calculate for the following reasons:   The 2019 ASCVD risk score is only valid for ages 57 to 64    Assessment & Plan:   Problem List Items Addressed This Visit       Other   Morbid obesity with BMI of 50.0-59.9, adult (HCC) - Primary   Morbid obesity due to excess calories.  Had some success with Wegovy, but could not afford this any longer.   Challenges with dietary changes due to work schedule and lifestyle, including difficulty avoiding fast food and sugary drinks, particularly sweet tea. - Encouraged dietary modifications, including reducing carbohydrate and empty calorie intake. - Advised increasing water intake and reducing sweet tea consumption. - Suggested small changes such as portion control and choosing healthier fast food options like wraps. - Discussed the use of Crystal Light as a sugar-free alternative to sweet tea.  Suspected sleep apnea with reported snoring. Insurance did not cover previous sleep study attempts. - Ordered home sleep study with cash pay option.      Relevant Orders   Ambulatory referral to Sleep Studies   Other Visit Diagnoses       Idiopathic hypersomnia with long sleep time       home sleep study ordered since his insurance wouldn't cover traditional sleep study.   Relevant Orders   Ambulatory referral to Sleep Studies           Return in about 4 months (around 09/28/2024) for chronic follow-up with PCP.    Leita Longs, FNP

## 2024-06-01 NOTE — Assessment & Plan Note (Signed)
 Morbid obesity due to excess calories.  Had some success with Wegovy, but could not afford this any longer.   Challenges with dietary changes due to work schedule and lifestyle, including difficulty avoiding fast food and sugary drinks, particularly sweet tea. - Encouraged dietary modifications, including reducing carbohydrate and empty calorie intake. - Advised increasing water intake and reducing sweet tea consumption. - Suggested small changes such as portion control and choosing healthier fast food options like wraps. - Discussed the use of Crystal Light as a sugar-free alternative to sweet tea.  Suspected sleep apnea with reported snoring. Insurance did not cover previous sleep study attempts. - Ordered home sleep study with cash pay option.

## 2024-06-13 ENCOUNTER — Other Ambulatory Visit: Payer: Self-pay

## 2024-06-13 ENCOUNTER — Emergency Department (HOSPITAL_COMMUNITY)

## 2024-06-13 ENCOUNTER — Emergency Department (HOSPITAL_COMMUNITY)
Admission: EM | Admit: 2024-06-13 | Discharge: 2024-06-13 | Disposition: A | Discharge status: Home / Self Care | Attending: Emergency Medicine | Admitting: Emergency Medicine

## 2024-06-13 ENCOUNTER — Encounter (HOSPITAL_COMMUNITY): Payer: Self-pay

## 2024-06-13 DIAGNOSIS — S93492A Sprain of other ligament of left ankle, initial encounter: Secondary | ICD-10-CM | POA: Diagnosis not present

## 2024-06-13 DIAGNOSIS — S93432A Sprain of tibiofibular ligament of left ankle, initial encounter: Secondary | ICD-10-CM | POA: Diagnosis not present

## 2024-06-13 DIAGNOSIS — M19072 Primary osteoarthritis, left ankle and foot: Secondary | ICD-10-CM | POA: Diagnosis not present

## 2024-06-13 DIAGNOSIS — S99912A Unspecified injury of left ankle, initial encounter: Secondary | ICD-10-CM | POA: Diagnosis not present

## 2024-06-13 DIAGNOSIS — X501XXA Overexertion from prolonged static or awkward postures, initial encounter: Secondary | ICD-10-CM | POA: Diagnosis not present

## 2024-06-13 DIAGNOSIS — M25572 Pain in left ankle and joints of left foot: Secondary | ICD-10-CM | POA: Diagnosis not present

## 2024-06-13 DIAGNOSIS — M7732 Calcaneal spur, left foot: Secondary | ICD-10-CM | POA: Diagnosis not present

## 2024-06-13 DIAGNOSIS — R600 Localized edema: Secondary | ICD-10-CM | POA: Diagnosis not present

## 2024-06-13 DIAGNOSIS — Z7982 Long term (current) use of aspirin: Secondary | ICD-10-CM | POA: Insufficient documentation

## 2024-06-13 NOTE — ED Provider Notes (Signed)
 [place] EMERGENCY DEPARTMENT AT [facility] Provider Note   CSN: [id number] Arrival date & time: 06/13/24  9281     History  Chief Complaint  Patient presents with   Ankle Pain    Karl Nicholson is a 38 y.o. male with PMH as listed below who presents with left ankle pain.  Patient states that he Rolled his left ankle several days ago and it has been getting worse and not better.  He has been ambulatory on it a lot at work and he does a lot of walking at work.  He notes at the end of the day it is very swollen and painful.  Mostly sore below his left lateral malleolus.  No pain in the midfoot, leg, knee.  Did not fall or have any other injuries.  Otherwise in his normal state of health.   Past Medical History:  Diagnosis Date   History of atrial fibrillation        Home Medications Prior to Admission medications   Medication Sig Start Date End Date Taking? Authorizing Provider  aspirin  EC 81 MG tablet Take 81 mg by mouth daily. Swallow whole.    [provider]  olmesartan  (BENICAR ) 20 MG tablet Take 1 tablet (20 mg total) by mouth daily. 02/28/24 02/22/25  Melvenia Manus BRAVO, MD      Allergies    Penicillins    Review of Systems   Review of Systems A 10 point review of systems was performed and is negative unless otherwise reported in HPI.  Physical Exam Updated Vital Signs BP 132/87   Pulse 83   Temp 97.9 F (36.6 C) (Oral)   Resp 18   Ht 5' 11 (1.803 m)   Wt (!) 176.9 kg   SpO2 97%   BMI 54.39 kg/m  Physical Exam General: Normal appearing obese male, lying in bed.  HEENT: Sclera anicteric, MMM, trachea midline.  Cardiology: RRR, no murmurs/rubs/gallops.  Resp: Normal respiratory rate and effort. CTAB, no wheezes, rhonchi, crackles.  Abd: Soft, non-tender, non-distended. No rebound tenderness or guarding.  GU: Deferred. MSK: No peripheral edema or signs of trauma. Extremities without deformity or TTP. No cyanosis or clubbing. Skin: warm,  dry. Neuro: A&Ox4, CNs II-XII grossly intact. MAEs. Sensation grossly intact.  Psych: Normal mood and affect.   ED Results / Procedures / Treatments   Labs (all labs ordered are listed, but only abnormal results are displayed) Labs Reviewed - No data to display  EKG None  Radiology DG Ankle Complete Left Result Date: 06/13/2024 EXAM: 3 OR MORE VIEW(S) XRAY OF THE LEFT ANKLE 06/13/2024 07:47:00 AM CLINICAL HISTORY: pain. Per chart: Pt ambulatory to er room number three, pt states that he is here for L ankle pain. Pt states that he thinks that he hurt it about two weeks ago, pt states that it has been bothering him since, states that it is more painful and swollen at the end of the day after he has been up and walking around. COMPARISON: None available. FINDINGS: BONES AND JOINTS: No acute fracture. Plantar calcaneal spur. Tibiotalar joint osteoarthritis. No joint dislocation. SOFT TISSUES: Diffuse edema of the lower extremity. IMPRESSION: 1. No acute osseous abnormality. 2. Diffuse edema of the lower extremity. 3. Plantar calcaneal spur. 4. Tibiotalar joint osteoarthritis. Electronically signed by: Waddell Calk MD 06/13/2024 07:52 AM EDT RP Workstation: [id number]    Procedures Procedures    Medications Ordered in ED Medications - No data to display  ED Course/ Medical Decision  Making/ A&P                          Medical Decision Making Amount and/or Complexity of Data Reviewed Radiology: ordered. Decision-making details documented in ED Course.    This patient presents to the ED for concern of left ankle pain, this involves an extensive number of treatment options, and is a complaint that carries with it a high risk of complications and morbidity.  Very well-appearing and hemodynamically stable.  MDM:    Patient seen and evaluated. VSS, afebrile, well appearing. Physical exam pertinent for tenderness palpation of the anterior talofibular ligament. DDx includes, but is not  limited to: Right ankle sprain is most likely diagnosis. Also considered, but less likely includes: Also considered is a Lisfranc fracture, however the patient did not have a high mechanism injury and there is no tenderness palpation over the also considered the fifth metatarsal fracture but the patient does not have tenderness over this area and is able to bear weight without pain. Also considered a fibular fracture however the patient does not have exquisite swelling and no x-ray evidence of this fracture. Also palpated over the proximal fibula with no palpation suggesting that there is no fracture in association with this ankle injury. Patient is neurovascularly intact beyond the injury.  Patient has an x-ray that is negative except for edema.  He has a stable joint and is able to bear weight.  Believe he can be managed with RICE and NSAIDs at home.  Patient has continued to bear weight on the injury due to his job and I suggest he take several days off of work for adequate RICE.  Patient will be given an Ace wrap for compression and instructed for RICE, given a note for work.  Instructed to alternate Tylenol  ibuprofen at home.   Clinical Course as of 06/13/24 0816  Sat Jun 13, 2024  0759 DG Ankle Complete Left 1. No acute osseous abnormality. 2. Diffuse edema of the lower extremity. 3. Plantar calcaneal spur. 4. Tibiotalar joint osteoarthritis.   [HN]    Clinical Course User Index [HN] Franklyn Sid SAILOR, MD   Imaging Studies ordered: left ankle x-ray ordered from triage I independently visualized and interpreted imaging. I agree with the radiologist interpretation  Additional history obtained from chart review.    Social Determinants of Health:  lives independently  Disposition:  DC w/ discharge instructions/return precautions. All questions answered to patient's satisfaction.    Co morbidities that complicate the patient evaluation  Past Medical History:  Diagnosis Date    History of atrial fibrillation      Medicines No orders of the defined types were placed in this encounter.   I have reviewed the patients home medicines and have made adjustments as needed  Problem List / ED Course: Problem List Items Addressed This Visit   None Visit Diagnoses       Sprain of anterior talofibular ligament of left ankle, initial encounter    -  Primary                   This note was created using dictation software, which may contain spelling or grammatical errors.    Franklyn Sid SAILOR, MD 06/13/24 [phone]

## 2024-06-13 NOTE — ED Triage Notes (Signed)
 Pt ambulatory to er room number three, pt states that he is here for L ankle pain.  Pt states that he thinks that he hurt it about two weeks ago, pt states that it has been bothering him since, states that it is more painful and swollen at the end of the day after he has been up and walking around.

## 2024-06-13 NOTE — Discharge Instructions (Addendum)
 Thank you for coming to [facility] Emergency Department. You were seen for left ankle sprain.  Please rest, ice, and elevate your left ankle.   This will help reduce swelling and pain.  You can use an Ace wrap for compression to also help the swelling. You can alternate Tylenol  and ibuprofen for pain and swelling.  You can take Tylenol  1000 mg every 8 hours and ibuprofen 600 mg every 8 hours. Please follow up with your primary care provider within 1 week if your symptoms have not improved.   Do not hesitate to return to the ED or call 911 if you experience: -Worsening symptoms -Lightheadedness, passing out -Fevers/chills -Anything else that concerns you

## 2024-07-01 ENCOUNTER — Encounter (HOSPITAL_COMMUNITY): Payer: Self-pay

## 2024-07-01 ENCOUNTER — Emergency Department (HOSPITAL_COMMUNITY): Admission: EM | Admit: 2024-07-01 | Discharge: 2024-07-01 | Disposition: A | Discharge status: Home / Self Care

## 2024-07-01 ENCOUNTER — Emergency Department (HOSPITAL_COMMUNITY)

## 2024-07-01 ENCOUNTER — Other Ambulatory Visit: Payer: Self-pay

## 2024-07-01 DIAGNOSIS — M25561 Pain in right knee: Secondary | ICD-10-CM | POA: Diagnosis not present

## 2024-07-01 DIAGNOSIS — W19XXXA Unspecified fall, initial encounter: Secondary | ICD-10-CM

## 2024-07-01 DIAGNOSIS — W1839XA Other fall on same level, initial encounter: Secondary | ICD-10-CM | POA: Diagnosis not present

## 2024-07-01 DIAGNOSIS — S8991XA Unspecified injury of right lower leg, initial encounter: Secondary | ICD-10-CM | POA: Diagnosis not present

## 2024-07-01 DIAGNOSIS — Z7982 Long term (current) use of aspirin: Secondary | ICD-10-CM | POA: Insufficient documentation

## 2024-07-01 NOTE — ED Provider Notes (Signed)
 [place] EMERGENCY DEPARTMENT AT [facility] Provider Note   CSN: [id number] Arrival date & time: 07/01/24  9260     Patient presents with: Injury   Karl Nicholson is a 38 y.o. male.   38 year old male presents for evaluation of right knee pain after a fall.  States he lost his balance and fell yesterday and landed on the lateral side of his right knee.  States he felt fine yesterday but today woke up and is having trouble walking due to lateral right knee pain.  Denies any other symptoms or concerns at this time.   Injury Pertinent negatives include no chest pain, no abdominal pain and no shortness of breath.       Prior to Admission medications   Medication Sig Start Date End Date Taking? Authorizing Provider  aspirin  EC 81 MG tablet Take 81 mg by mouth daily. Swallow whole.    [provider]  olmesartan  (BENICAR ) 20 MG tablet Take 1 tablet (20 mg total) by mouth daily. 02/28/24 02/22/25  Melvenia Manus BRAVO, MD    Allergies: Penicillins    Review of Systems  Constitutional:  Negative for chills and fever.  HENT:  Negative for ear pain and sore throat.   Eyes:  Negative for pain and visual disturbance.  Respiratory:  Negative for cough and shortness of breath.   Cardiovascular:  Negative for chest pain and palpitations.  Gastrointestinal:  Negative for abdominal pain and vomiting.  Genitourinary:  Negative for dysuria and hematuria.  Musculoskeletal:  Negative for arthralgias and back pain.       Admits right knee pain  Skin:  Negative for color change and rash.  Neurological:  Negative for seizures and syncope.  All other systems reviewed and are negative.   Updated Vital Signs BP 116/67   Pulse 91   Temp 98.2 F (36.8 C) (Oral)   Resp 16   Ht 5' 11 (1.803 m)   Wt (!) 174.6 kg   SpO2 95%   BMI 53.70 kg/m   Physical Exam Vitals and nursing note reviewed.  Constitutional:      General: He is not in acute distress.    Appearance: Normal  appearance. He is well-developed. He is not ill-appearing.  HENT:     Head: Normocephalic and atraumatic.  Eyes:     Conjunctiva/sclera: Conjunctivae normal.  Cardiovascular:     Rate and Rhythm: Normal rate and regular rhythm.     Heart sounds: No murmur heard. Pulmonary:     Effort: Pulmonary effort is normal. No respiratory distress.     Breath sounds: Normal breath sounds.  Abdominal:     Palpations: Abdomen is soft.     Tenderness: There is no abdominal tenderness.  Musculoskeletal:        General: Tenderness present. No swelling or deformity.     Cervical back: Neck supple.     Comments: There is mild right lateral knee tenderness to palpation   Skin:    General: Skin is warm and dry.     Capillary Refill: Capillary refill takes less than 2 seconds.  Neurological:     Mental Status: He is alert.  Psychiatric:        Mood and Affect: Mood normal.     (all labs ordered are listed, but only abnormal results are displayed) Labs Reviewed - No data to display  EKG: None  Radiology: DG Knee Complete 4 Views Right Result Date: 07/01/2024 CLINICAL DATA:  fall, lateral right knee  pain EXAM: RIGHT KNEE - COMPLETE 4+ VIEW COMPARISON:  None Available. FINDINGS: No acute fracture or dislocation. No joint effusion. Mild-to-moderate patellofemoral joint space loss. Lateral and patellofemoral joint space osteophyte formation. Soft tissues are unremarkable. IMPRESSION: 1. No acute fracture or dislocation. 2. Early degenerative changes, most pronounced within the patellofemoral joint space. Electronically Signed   By: Rogelia Myers M.D.   On: 07/01/2024 08:19     Procedures   Medications Ordered in the ED - No data to display                                  Medical Decision Making Patient here for right knee injury after a fall, likely sprain.  Will be placed in Ace wrap and I advised Tylenol  Motrin as needed for pain.  Advised ice as needed and follow-up with primary care if  his symptoms do not improve.  Imaging was negative for acute fracture or bony abnormality.  This was all discussed with the patient.  Advise return for new or worsening symptoms.  He feels comfortable being discharged home.  Problems Addressed: Fall, initial encounter: acute illness or injury Injury of right knee, initial encounter: acute illness or injury  Amount and/or Complexity of Data Reviewed External Data Reviewed: notes.    Details: Previous ED visit reviewed from 5-20 submitted patient was here for ankle sprain Radiology: ordered and independent interpretation performed. Decision-making details documented in ED Course.    Details: Ordered and interpreted by me independently of radiology Right knee x-ray: Shows degenerative changes but no acute fracture or malalignment  Risk OTC drugs. Prescription drug management.    Final diagnoses:  Injury of right knee, initial encounter  Fall, initial encounter    ED Discharge Orders     None          Gennaro Duwaine CROME, DO 07/01/24 [phone]

## 2024-07-01 NOTE — ED Triage Notes (Signed)
 Pt c/o right knee pain; states he stepped grease and fell injuring right knee. Denies LOC, N/V/D, headache; A&Ox4.

## 2024-07-01 NOTE — Discharge Instructions (Addendum)
 Wear Ace wrap as needed for pain and support.  You can also use ice, Tylenol  and Motrin as needed for pain.  Your symptoms should improve and is safe for you to walk.  You will be sore for the next 1 to 2 weeks and you should follow-up with your primary care doctor if your symptoms do not improve.

## 2024-08-10 ENCOUNTER — Emergency Department (HOSPITAL_COMMUNITY)

## 2024-08-10 ENCOUNTER — Emergency Department (HOSPITAL_COMMUNITY)
Admission: EM | Admit: 2024-08-10 | Discharge: 2024-08-10 | Disposition: A | Discharge status: Home / Self Care | Attending: Emergency Medicine | Admitting: Emergency Medicine

## 2024-08-10 ENCOUNTER — Other Ambulatory Visit: Payer: Self-pay

## 2024-08-10 ENCOUNTER — Encounter (HOSPITAL_COMMUNITY): Payer: Self-pay | Admitting: Emergency Medicine

## 2024-08-10 DIAGNOSIS — M79674 Pain in right toe(s): Secondary | ICD-10-CM | POA: Diagnosis not present

## 2024-08-10 DIAGNOSIS — M7989 Other specified soft tissue disorders: Secondary | ICD-10-CM | POA: Diagnosis not present

## 2024-08-10 DIAGNOSIS — M79671 Pain in right foot: Secondary | ICD-10-CM | POA: Diagnosis not present

## 2024-08-10 DIAGNOSIS — M109 Gout, unspecified: Secondary | ICD-10-CM | POA: Diagnosis not present

## 2024-08-10 DIAGNOSIS — Z7982 Long term (current) use of aspirin: Secondary | ICD-10-CM | POA: Diagnosis not present

## 2024-08-10 DIAGNOSIS — R600 Localized edema: Secondary | ICD-10-CM | POA: Diagnosis not present

## 2024-08-10 HISTORY — DX: Essential (primary) hypertension: I10

## 2024-08-10 LAB — CBG MONITORING, ED: Glucose-Capillary: 102 mg/dL — ABNORMAL HIGH (ref 70–99)

## 2024-08-10 MED ORDER — PREDNISONE 10 MG PO TABS: 40.0000 mg | ORAL_TABLET | Freq: Every day | ORAL | 0 refills | Status: AC

## 2024-08-10 MED ORDER — NAPROXEN 500 MG PO TABS
500.0000 mg | ORAL_TABLET | Freq: Two times a day (BID) | ORAL | 0 refills | Status: AC
Start: 2024-08-10 — End: ?

## 2024-08-10 NOTE — Discharge Instructions (Signed)
 Please follow-up closely with your primary care doctor on an outpatient basis for reevaluation.  Return to the emergency department immediately for any new or worsening symptoms.

## 2024-08-10 NOTE — ED Triage Notes (Signed)
 Pt to the ED with complaints of right foot pain with no known injury.

## 2024-08-10 NOTE — ED Provider Notes (Signed)
 Karl Nicholson AT [facility] Provider Note   CSN: [id number] Arrival date & time: 08/10/24  9260     Patient presents with: Foot Pain   Karl Nicholson is a 38 y.o. male.   Patient is a 38 year old male who presents to the emergency Nicholson the chief complaint of pain to the right foot just over the right first MTP joint.  Patient denies any recent falls or blunt trauma.  He notes that he has had some mild redness and swelling over the joint.  He denies any history of gout but notes that he does have a family history of gout.  Patient denies any associated numbness or paresthesias.  He has had no other swelling throughout the foot or lower extremity.  He has had no associated chest pain or shortness of breath.   Foot Pain       Prior to Admission medications   Medication Sig Start Date End Date Taking? Authorizing Provider  aspirin  EC 81 MG tablet Take 81 mg by mouth daily. Swallow whole.    [provider]  olmesartan  (BENICAR ) 20 MG tablet Take 1 tablet (20 mg total) by mouth daily. 02/28/24 02/22/25  Melvenia Manus BRAVO, MD    Allergies: Penicillins    Review of Systems  Musculoskeletal:        Pain to right foot  All other systems reviewed and are negative.   Updated Vital Signs BP (!) 140/84 (BP Location: Right Arm)   Pulse 99   Temp 98.1 F (36.7 C) (Oral)   Resp 18   Ht 6' (1.829 m)   Wt (!) 181.4 kg   SpO2 96%   BMI 54.25 kg/m   Physical Exam Vitals and nursing note reviewed.  Constitutional:      General: He is not in acute distress.    Appearance: Normal appearance. He is not ill-appearing.  HENT:     Head: Normocephalic and atraumatic.  Eyes:     Extraocular Movements: Extraocular movements intact.     Conjunctiva/sclera: Conjunctivae normal.     Pupils: Pupils are equal, round, and reactive to light.  Cardiovascular:     Rate and Rhythm: Normal rate and regular rhythm.     Pulses: Normal pulses.     Heart  sounds: Normal heart sounds.  Pulmonary:     Effort: Pulmonary effort is normal. No respiratory distress.  Musculoskeletal:        General: Normal range of motion.     Cervical back: Normal range of motion and neck supple. No rigidity or tenderness.     Comments: Tenderness palpation over the right first MTP joint with mild overlying edema and mild erythema, DP and PT pulses are 2+, cap refill less than 2 seconds distally, sensation intact distally, full range of motion noted throughout, no obvious deformity or bruising, no skin breakdown or ulceration, no lacerations or abrasions, nontender palpation of remainder of right foot, ankle, knee, hip  Skin:    General: Skin is warm and dry.  Neurological:     General: No focal deficit present.     Mental Status: He is alert and oriented to person, place, and time. Mental status is at baseline.  Psychiatric:        Mood and Affect: Mood normal.        Behavior: Behavior normal.        Thought Content: Thought content normal.        Judgment: Judgment normal.     (  all labs ordered are listed, but only abnormal results are displayed) Labs Reviewed  CBG MONITORING, ED    EKG: None  Radiology: No results found.   Procedures   Medications Ordered in the ED - No data to display  Clinical Course as of 08/10/24 0919  Mon Aug 10, 2024  0919 Glucose is 102 [CR]    Clinical Course User Index [CR] [name], PA-C                                 Medical Decision Making Patient is doing well at this time and is stable for discharge home.  Discussed with patient that I suspect that symptoms are secondary to gout at this time.  Do not suspect that blood work is warranted given the low yield that this will provide.  Low suspicion for septic joint at this point.  Patient is in agreement to forego arthrocentesis.  Patient does have stable vital signs with no indication for sepsis.  He has no clinical indication for abscess formation,  cellulitis, necrotizing fasciitis.  He has strong peripheral pulses with no indication for underlying acute arterial insufficiency.  Do not suspect DVT.  The need for close follow-up with primary care doctor for reevaluation was discussed as well as strict turn precautions for any new or worsening symptoms.  Patient voiced understanding and had no additional questions.  Amount and/or Complexity of Data Reviewed Radiology: ordered.  Risk Prescription drug management.        Final diagnoses:  None    ED Discharge Orders     None          [name] [name] 08/10/24 0913    Charlyn Sora, MD 08/10/24 [phone]

## 2024-10-02 ENCOUNTER — Ambulatory Visit

## 2024-10-07 ENCOUNTER — Encounter (HOSPITAL_COMMUNITY): Payer: Self-pay

## 2024-10-07 ENCOUNTER — Emergency Department (HOSPITAL_COMMUNITY)

## 2024-10-07 ENCOUNTER — Other Ambulatory Visit: Payer: Self-pay

## 2024-10-07 ENCOUNTER — Emergency Department (HOSPITAL_COMMUNITY)
Admission: EM | Admit: 2024-10-07 | Discharge: 2024-10-07 | Disposition: A | Discharge status: Home / Self Care | Attending: Emergency Medicine | Admitting: Emergency Medicine

## 2024-10-07 DIAGNOSIS — I10 Essential (primary) hypertension: Secondary | ICD-10-CM | POA: Diagnosis not present

## 2024-10-07 DIAGNOSIS — Z7982 Long term (current) use of aspirin: Secondary | ICD-10-CM | POA: Insufficient documentation

## 2024-10-07 DIAGNOSIS — Z79899 Other long term (current) drug therapy: Secondary | ICD-10-CM | POA: Insufficient documentation

## 2024-10-07 DIAGNOSIS — R002 Palpitations: Secondary | ICD-10-CM | POA: Diagnosis present

## 2024-10-07 LAB — CBC
HCT: 46.8 % (ref 39.0–52.0)
Hemoglobin: 15.5 g/dL (ref 13.0–17.0)
MCH: 28.5 pg (ref 26.0–34.0)
MCHC: 33.1 g/dL (ref 30.0–36.0)
MCV: 86 fL (ref 80.0–100.0)
Platelets: 273 K/uL (ref 150–400)
RBC: 5.44 MIL/uL (ref 4.22–5.81)
RDW: 13.2 % (ref 11.5–15.5)
WBC: 7.1 K/uL (ref 4.0–10.5)
nRBC: 0 % (ref 0.0–0.2)

## 2024-10-07 LAB — BASIC METABOLIC PANEL WITH GFR
Anion gap: 12 (ref 5–15)
BUN: 12 mg/dL (ref 6–20)
CO2: 27 mmol/L (ref 22–32)
Calcium: 8.9 mg/dL (ref 8.9–10.3)
Chloride: 100 mmol/L (ref 98–111)
Creatinine, Ser: 1.08 mg/dL (ref 0.61–1.24)
GFR, Estimated: >60 mL/min
Glucose, Bld: 142 mg/dL — ABNORMAL HIGH (ref 70–99)
Potassium: 4 mmol/L (ref 3.5–5.1)
Sodium: 138 mmol/L (ref 135–145)

## 2024-10-07 LAB — TROPONIN T, HIGH SENSITIVITY: Troponin T High Sensitivity: 12 ng/L (ref 0–19)

## 2024-10-07 LAB — MAGNESIUM: Magnesium: 2.2 mg/dL (ref 1.7–2.4)

## 2024-10-07 NOTE — ED Triage Notes (Signed)
 Patient come in POV for complain of chest pain that started this am around 3-4am, noticed heart beating fast and Nausea and vomited with decreased pain in my chest, I have had some numbness to hands. Pt denies blurry vision/headache.

## 2024-10-07 NOTE — Discharge Instructions (Signed)
 You were seen for your palpitations in the emergency department.  It was likely due to atrial fibrillation but you converted to a normal rhythm prior to coming to the emergency department  At home, please limit your caffeine intake.    Follow-up with your primary doctor in 2-3 days regarding your visit.  Please talk to them about arranging a sleep study since sleep apnea can contribute to A-fib.  Cardiology will be calling you regarding an appointment within the next 72 hours.  You may contact them if you do not hear from them in that time using the information in this packet.  Please talk to them to see if you need an outpatient (Holter) monitor or to be started on any medications for atrial fibrillation  Return immediately to the emergency department if you experience any of the following: Chest pain, shortness of breath, fainting, or any other concerning symptoms.    Thank you for visiting our Emergency Department. It was a pleasure taking care of you today.

## 2024-10-07 NOTE — ED Provider Notes (Signed)
 " [place] [facility] AT [facility] Provider Note   CSN: [id number] Arrival date & time: 10/07/24  9261     Patient presents with: Chest Pain   Karl Nicholson is a 39 y.o. male.   39 year old male history of obesity, hypertension, and paroxysmal atrial fibrillation who presents emergency department with palpitations, nausea and vomiting, and bilateral paresthesias.  Patient reports that last night around 3:30 AM he had several minutes where it felt like his heart was pounding.  [place] he got sick and threw up.  Shortly afterwards the symptoms resolved.  No chest pain during this but says that it only the discomfort from the palpitations.  Went to sleep and woke up and he has paresthesias in both of his hands so decided to come into the emergency department.       Prior to Admission medications  Medication Sig Start Date End Date Taking? Authorizing Provider  aspirin  EC 81 MG tablet Take 81 mg by mouth daily. Swallow whole.    [provider]  naproxen  (NAPROSYN ) 500 MG tablet Take 1 tablet (500 mg total) by mouth 2 (two) times daily. 08/10/24   [name], PA-C  olmesartan  (BENICAR ) 20 MG tablet Take 1 tablet (20 mg total) by mouth daily. 02/28/24 02/22/25  Melvenia Manus BRAVO, MD    Allergies: Penicillins    Review of Systems  Updated Vital Signs BP 121/68   Pulse 85   Temp 98 F (36.7 C) (Oral)   Resp 16   Ht 6' (1.829 m)   Wt (!) 181.4 kg   SpO2 96%   BMI 54.25 kg/m   Physical Exam Vitals and nursing note reviewed.  Constitutional:      General: He is not in acute distress.    Appearance: He is well-developed.  HENT:     Head: Normocephalic and atraumatic.     Right Ear: External ear normal.     Left Ear: External ear normal.     Nose: Nose normal.  Eyes:     Extraocular Movements: Extraocular movements intact.     Conjunctiva/sclera: Conjunctivae normal.     Pupils: Pupils are equal, round, and reactive to light.   Cardiovascular:     Rate and Rhythm: Normal rate and regular rhythm.     Heart sounds: Normal heart sounds.     Comments: Radial pulses 2+ bilaterally Pulmonary:     Effort: Pulmonary effort is normal. No respiratory distress.     Breath sounds: Normal breath sounds.  Musculoskeletal:     Cervical back: Normal range of motion and neck supple.     Right lower leg: No edema.     Left lower leg: No edema.  Skin:    General: Skin is warm and dry.  Neurological:     Mental Status: He is alert. Mental status is at baseline.     Cranial Nerves: No cranial nerve deficit.     Sensory: No sensory deficit.     Motor: No weakness.  Psychiatric:        Mood and Affect: Mood normal.        Behavior: Behavior normal.     (all labs ordered are listed, but only abnormal results are displayed) Labs Reviewed  BASIC METABOLIC PANEL WITH GFR - Abnormal; Notable for the following components:      Result Value   Glucose, Bld 142 (*)    All other components within normal limits  CBC  MAGNESIUM   TROPONIN T,  HIGH SENSITIVITY    EKG: EKG Interpretation Date/Time:  Wednesday October 07 2024 07:51:56 EST Ventricular Rate:  89 PR Interval:  166 QRS Duration:  86 QT Interval:  332 QTC Calculation: 404 R Axis:   9  Text Interpretation: Sinus rhythm Low voltage, precordial leads Abnormal R-wave progression, early transition Confirmed by Yolande Charleston [phone]) on 10/07/2024 8:02:32 AM  Radiology: DG Chest 2 View Result Date: 10/07/2024 CLINICAL DATA:  Chest pain. EXAM: CHEST - 2 VIEW COMPARISON:  Chest radiograph dated 10/03/2023. FINDINGS: Faint bibasilar interstitial atelectasis. Developing infiltrate is less likely. No focal consolidation, pleural effusion, pneumothorax. The cardiac silhouette is within normal limits. No acute osseous pathology. IMPRESSION: No focal consolidation. Electronically Signed   By: Vanetta Chou M.D.   On: 10/07/2024 08:13     Procedures   Medications Ordered  in the ED - No data to display                                  Medical Decision Making Amount and/or Complexity of Data Reviewed Labs: ordered. Radiology: ordered.   Karl Nicholson is a 39 year old male history of obesity, hypertension, and paroxysmal atrial fibrillation who presents emergency department with palpitations, nausea and vomiting, and bilateral paresthesias.    Initial Ddx:  Atrial fibrillation, paroxysmal arrhythmia, electrolyte abnormality, stroke  MDM/Course:  Patient resents emergency department with palpitations, vomiting, and bilateral paresthesias.  Reports that the palpitations have resolved.  Does have a history of paroxysmal arrhythmia and reports that it felt similar.  Denied any chest pain to me.  On exam is in normal sinus rhythm.  Vital signs are overall reassuring.  No focal neurologic deficits to suggest stroke.  EKG with low voltage likely due to the patient's habitus but sinus rhythm.  CBC and CMP unremarkable.  Magnesium  WNL.  Chest x-ray without acute findings.  Upon re-evaluation patient remained in sinus rhythm.  I suspect that he went into atrial fibrillation and self converted prior to arrival.  Had counseled him to cut down on caffeinated tea that he has been drinking and try to get the sleep study.  Cardiology was trying to coordinate.  Will have him follow-up with A-fib clinic as well to see if he needs a Holter monitor or to be started on any medications  This patient presents to the ED for concern of complaints listed in HPI, this involves an extensive number of treatment options, and is a complaint that carries with it a high risk of complications and morbidity. Disposition including potential need for admission considered.   Dispo: DC Home. Return precautions discussed including, but not limited to, those listed in the AVS. Allowed pt time to ask questions which were answered fully prior to dc.  I have reviewed the patients home medications and  made adjustments as needed Records reviewed Outpatient Clinic Notes The following labs were independently interpreted: Chemistry and show no acute abnormality I independently reviewed the following imaging with scope of interpretation limited to determining acute life threatening conditions related to emergency care: Chest x-ray and agree with the radiologist interpretation with the following exceptions: none I personally reviewed and interpreted cardiac monitoring: normal sinus rhythm  I personally reviewed and interpreted the pt's EKG: see above for interpretation   Portions of this note were generated with Dragon dictation software. Dictation errors may occur despite best attempts at proofreading.      Final diagnoses:  Palpitations  ED Discharge Orders          Ordered    Amb Referral to AFIB Clinic        10/07/24 0845               Yolande Lamar BROCKS, MD 10/07/24 1936  "

## 2024-10-20 ENCOUNTER — Inpatient Hospital Stay: Payer: Self-pay
